# Patient Record
Sex: Male | Born: 1990 | Race: White | Hispanic: No | Marital: Single | Smoking: Current every day smoker
Health system: Southern US, Community
[De-identification: ages and names within clinical notes are randomized; demographics above are authoritative.]

## PROBLEM LIST (undated history)

## (undated) DIAGNOSIS — R112 Nausea with vomiting, unspecified: Secondary | ICD-10-CM

## (undated) DIAGNOSIS — Z9889 Other specified postprocedural states: Secondary | ICD-10-CM

## (undated) DIAGNOSIS — F419 Anxiety disorder, unspecified: Secondary | ICD-10-CM

## (undated) DIAGNOSIS — J343 Hypertrophy of nasal turbinates: Secondary | ICD-10-CM

## (undated) HISTORY — PX: WISDOM TOOTH EXTRACTION: SHX21

## (undated) HISTORY — PX: NOSE SURGERY: SHX723

## (undated) HISTORY — DX: Anxiety disorder, unspecified: F41.9

## (undated) HISTORY — PX: LEG SURGERY: SHX1003

---
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
## 2009-11-24 ENCOUNTER — Ambulatory Visit: Payer: Self-pay | Admitting: Family Medicine

## 2009-11-24 DIAGNOSIS — Z87448 Personal history of other diseases of urinary system: Secondary | ICD-10-CM | POA: Insufficient documentation

## 2009-11-24 LAB — CONVERTED CEMR LAB
Bilirubin Urine: NEGATIVE
Blood in Urine, dipstick: NEGATIVE
Glucose, Urine, Semiquant: NEGATIVE
Ketones, urine, test strip: NEGATIVE
Nitrite: NEGATIVE
Protein, U semiquant: NEGATIVE
Specific Gravity, Urine: 1.015
Urobilinogen, UA: 0.2
WBC Urine, dipstick: NEGATIVE
pH: 6.5

## 2009-11-25 ENCOUNTER — Ambulatory Visit: Payer: Self-pay | Admitting: Family Medicine

## 2009-11-25 DIAGNOSIS — R361 Hematospermia: Secondary | ICD-10-CM | POA: Insufficient documentation

## 2009-11-26 ENCOUNTER — Encounter: Payer: Self-pay | Admitting: Family Medicine

## 2009-11-26 LAB — CONVERTED CEMR LAB
Chlamydia, DNA Probe: NEGATIVE
GC Probe Amp, Genital: NEGATIVE

## 2009-11-28 ENCOUNTER — Ambulatory Visit: Payer: Self-pay | Admitting: Family Medicine

## 2009-11-29 ENCOUNTER — Encounter (INDEPENDENT_AMBULATORY_CARE_PROVIDER_SITE_OTHER): Payer: Self-pay | Admitting: *Deleted

## 2009-12-02 ENCOUNTER — Ambulatory Visit: Payer: Self-pay | Admitting: Family Medicine

## 2009-12-14 ENCOUNTER — Encounter: Payer: Self-pay | Admitting: Family Medicine

## 2009-12-20 ENCOUNTER — Ambulatory Visit: Payer: Self-pay | Admitting: Occupational Medicine

## 2010-03-03 ENCOUNTER — Ambulatory Visit: Payer: Self-pay | Admitting: Family Medicine

## 2010-03-03 DIAGNOSIS — F419 Anxiety disorder, unspecified: Secondary | ICD-10-CM

## 2010-03-03 DIAGNOSIS — N509 Disorder of male genital organs, unspecified: Secondary | ICD-10-CM | POA: Insufficient documentation

## 2010-03-03 DIAGNOSIS — R6882 Decreased libido: Secondary | ICD-10-CM | POA: Insufficient documentation

## 2010-03-03 DIAGNOSIS — F32A Depression, unspecified: Secondary | ICD-10-CM | POA: Insufficient documentation

## 2010-03-03 DIAGNOSIS — F329 Major depressive disorder, single episode, unspecified: Secondary | ICD-10-CM

## 2010-03-06 ENCOUNTER — Encounter: Payer: Self-pay | Admitting: Family Medicine

## 2010-03-07 LAB — CONVERTED CEMR LAB: Testosterone: 519.23 ng/dL (ref 350–890)

## 2010-03-13 ENCOUNTER — Ambulatory Visit: Payer: Self-pay | Admitting: Internal Medicine

## 2010-03-13 ENCOUNTER — Telehealth (INDEPENDENT_AMBULATORY_CARE_PROVIDER_SITE_OTHER): Payer: Self-pay | Admitting: *Deleted

## 2010-03-13 LAB — CONVERTED CEMR LAB
Bilirubin Urine: NEGATIVE
Blood in Urine, dipstick: NEGATIVE
GC Probe Amp, Urine: NEGATIVE
Glucose, Urine, Semiquant: NEGATIVE
Ketones, urine, test strip: NEGATIVE
Nitrite: NEGATIVE
Protein, U semiquant: 100
Specific Gravity, Urine: 1.02
Urobilinogen, UA: 0.2
WBC Urine, dipstick: NEGATIVE
pH: 5

## 2010-03-14 ENCOUNTER — Encounter: Payer: Self-pay | Admitting: Internal Medicine

## 2010-03-14 LAB — CONVERTED CEMR LAB
AFP-Tumor Marker: 1.4 ng/mL (ref 0.0–8.0)
Bacteria, UA: NONE SEEN
Basophils Absolute: 0.1 10*3/uL (ref 0.0–0.1)
Basophils Relative: 1 % (ref 0–1)
Bilirubin Urine: NEGATIVE
Casts: NONE SEEN /lpf
Crystals: NONE SEEN
Eosinophils Absolute: 0.7 10*3/uL (ref 0.0–0.7)
Eosinophils Relative: 11 % — ABNORMAL HIGH (ref 0–5)
GC Probe Amp, Urine: NEGATIVE
HCT: 47.7 % (ref 39.0–52.0)
Hemoglobin, Urine: NEGATIVE
Hemoglobin: 15.3 g/dL (ref 13.0–17.0)
Ketones, ur: NEGATIVE mg/dL
Leukocytes, UA: NEGATIVE
Lymphocytes Relative: 45 % (ref 12–46)
Lymphs Abs: 2.9 10*3/uL (ref 0.7–4.0)
MCHC: 32.1 g/dL (ref 30.0–36.0)
MCV: 92.4 fL (ref 78.0–100.0)
Monocytes Absolute: 0.8 10*3/uL (ref 0.1–1.0)
Monocytes Relative: 12 % (ref 3–12)
Neutro Abs: 2 10*3/uL (ref 1.7–7.7)
Neutrophils Relative %: 31 % — ABNORMAL LOW (ref 43–77)
Nitrite: NEGATIVE
PSA: 0.33 ng/mL (ref 0.10–4.00)
Platelets: 261 10*3/uL (ref 150–400)
Protein, ur: 100 mg/dL — AB
RBC / HPF: NONE SEEN (ref <3)
RBC: 5.16 M/uL (ref 4.22–5.81)
RDW: 13.6 % (ref 11.5–15.5)
Specific Gravity, Urine: 1.023 (ref 1.005–1.030)
Squamous Epithelial / LPF: NONE SEEN /lpf
TSH: 1.192 microintl units/mL (ref 0.350–4.500)
Urine Glucose: NEGATIVE mg/dL
Urobilinogen, UA: 0.2 (ref 0.0–1.0)
WBC, UA: NONE SEEN cells/hpf (ref <3)
WBC: 6.4 10*3/uL (ref 4.0–10.5)
pH: 6.5 (ref 5.0–8.0)

## 2010-03-15 ENCOUNTER — Telehealth: Payer: Self-pay | Admitting: Internal Medicine

## 2010-03-17 ENCOUNTER — Encounter: Payer: Self-pay | Admitting: Family Medicine

## 2010-03-22 ENCOUNTER — Telehealth (INDEPENDENT_AMBULATORY_CARE_PROVIDER_SITE_OTHER): Payer: Self-pay | Admitting: *Deleted

## 2010-03-30 ENCOUNTER — Ambulatory Visit: Payer: Self-pay | Admitting: Occupational Medicine

## 2010-04-05 ENCOUNTER — Ambulatory Visit (HOSPITAL_COMMUNITY): Admission: RE | Admit: 2010-04-05 | Discharge: 2010-04-05 | Payer: Self-pay | Admitting: Psychiatry

## 2010-04-05 ENCOUNTER — Emergency Department (HOSPITAL_COMMUNITY): Admission: EM | Admit: 2010-04-05 | Discharge: 2010-04-06 | Payer: Self-pay | Admitting: Emergency Medicine

## 2010-04-06 ENCOUNTER — Inpatient Hospital Stay (HOSPITAL_COMMUNITY): Admission: RE | Admit: 2010-04-06 | Discharge: 2010-04-10 | Payer: Self-pay | Admitting: Psychiatry

## 2010-04-06 ENCOUNTER — Ambulatory Visit: Payer: Self-pay | Admitting: Psychiatry

## 2010-04-08 ENCOUNTER — Ambulatory Visit (HOSPITAL_COMMUNITY): Admission: RE | Admit: 2010-04-08 | Discharge: 2010-04-08 | Payer: Self-pay | Admitting: Psychiatry

## 2010-04-27 ENCOUNTER — Ambulatory Visit (HOSPITAL_COMMUNITY): Payer: Self-pay | Admitting: Psychology

## 2010-05-02 ENCOUNTER — Ambulatory Visit (HOSPITAL_COMMUNITY): Payer: Self-pay | Admitting: Psychology

## 2010-05-04 ENCOUNTER — Ambulatory Visit (HOSPITAL_COMMUNITY): Payer: Self-pay | Admitting: Psychiatry

## 2010-05-09 ENCOUNTER — Ambulatory Visit (HOSPITAL_COMMUNITY): Payer: Self-pay | Admitting: Psychology

## 2010-05-18 ENCOUNTER — Ambulatory Visit (HOSPITAL_COMMUNITY): Payer: Self-pay | Admitting: Psychology

## 2010-05-23 ENCOUNTER — Ambulatory Visit (HOSPITAL_COMMUNITY): Payer: Self-pay | Admitting: Psychology

## 2010-05-24 ENCOUNTER — Telehealth (INDEPENDENT_AMBULATORY_CARE_PROVIDER_SITE_OTHER): Payer: Self-pay | Admitting: *Deleted

## 2010-06-07 ENCOUNTER — Ambulatory Visit (HOSPITAL_COMMUNITY): Payer: Self-pay | Admitting: Psychology

## 2010-06-12 ENCOUNTER — Ambulatory Visit (HOSPITAL_COMMUNITY): Payer: Self-pay | Admitting: Psychology

## 2010-06-20 ENCOUNTER — Ambulatory Visit (HOSPITAL_COMMUNITY): Payer: Self-pay | Admitting: Psychology

## 2010-06-21 ENCOUNTER — Ambulatory Visit (HOSPITAL_COMMUNITY): Payer: Self-pay | Admitting: Psychiatry

## 2010-06-27 ENCOUNTER — Ambulatory Visit (HOSPITAL_COMMUNITY): Payer: Self-pay | Admitting: Psychology

## 2010-08-24 ENCOUNTER — Ambulatory Visit (HOSPITAL_COMMUNITY): Payer: Self-pay | Admitting: Psychiatry

## 2010-08-31 ENCOUNTER — Ambulatory Visit (HOSPITAL_COMMUNITY): Payer: Self-pay | Admitting: Psychology

## 2010-09-20 ENCOUNTER — Ambulatory Visit (HOSPITAL_COMMUNITY): Payer: Self-pay | Admitting: Psychology

## 2010-09-26 ENCOUNTER — Ambulatory Visit: Payer: Self-pay | Admitting: Family Medicine

## 2010-09-26 DIAGNOSIS — M76899 Other specified enthesopathies of unspecified lower limb, excluding foot: Secondary | ICD-10-CM | POA: Insufficient documentation

## 2010-09-26 DIAGNOSIS — M461 Sacroiliitis, not elsewhere classified: Secondary | ICD-10-CM | POA: Insufficient documentation

## 2010-09-26 DIAGNOSIS — M069 Rheumatoid arthritis, unspecified: Secondary | ICD-10-CM | POA: Insufficient documentation

## 2010-10-10 ENCOUNTER — Ambulatory Visit: Payer: Self-pay | Admitting: Family Medicine

## 2010-10-10 DIAGNOSIS — M545 Low back pain, unspecified: Secondary | ICD-10-CM | POA: Insufficient documentation

## 2010-10-11 ENCOUNTER — Encounter: Payer: Self-pay | Admitting: Family Medicine

## 2010-10-19 ENCOUNTER — Ambulatory Visit (HOSPITAL_COMMUNITY): Payer: Self-pay | Admitting: Psychology

## 2010-11-02 ENCOUNTER — Ambulatory Visit (HOSPITAL_COMMUNITY): Payer: Self-pay | Admitting: Psychology

## 2010-11-20 ENCOUNTER — Ambulatory Visit (HOSPITAL_COMMUNITY)
Admission: RE | Admit: 2010-11-20 | Discharge: 2010-11-20 | Payer: Self-pay | Source: Home / Self Care | Attending: Psychiatry | Admitting: Psychiatry

## 2010-12-04 ENCOUNTER — Ambulatory Visit (HOSPITAL_COMMUNITY)
Admission: RE | Admit: 2010-12-04 | Discharge: 2010-12-04 | Payer: Self-pay | Source: Home / Self Care | Attending: Psychology | Admitting: Psychology
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
## 2010-12-05 NOTE — Letter (Signed)
Summary: CONTROLLED MED RX POLICY  CONTROLLED MED RX POLICY   Imported By: Dannette Barbara 09/26/2010 20:04:46  _____________________________________________________________________  External Attachment:    Type:   Image     Comment:   External Document
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
## 2010-12-05 NOTE — Assessment & Plan Note (Signed)
Summary: viral infection   Vital Signs:  Patient Profile:   20 Years Old Male Height:     71 inches (180.34 cm) Weight:      159 pounds O2 Sat:      96 % Temp:     98.7 degrees F axillary Pulse rate:   85 / minute BP sitting:   126 / 73  (left arm) Cuff size:   regular  Vitals Entered By: Harlene Salts (January 08, 2007 10:49 AM)               Visit Type:  acute PCP:  K Bowen  Chief Complaint:  aching in joints in arms and legs and runny nose in the mornings .  History of Present Illness: 20 yo WM presents today for 1 day of joint aches all over his body.  Subjective fevers.  Scratchy throat, no cough.  No GI upset.  Rhinorrhea.  Fatigue and Malaise.  Many sick contacts at school.  Ibuprofen helps.    Prior Medications: Current Allergies: No known allergies      Review of Systems      See HPI   Physical Exam  General:      good color and thin.   Head:      normocephalic and atraumatic  Eyes:      clear conjunctiva Ears:      TMs translucent and gray Nose:      clear nasal discharge Mouth:      throat injected, white exudate, post nasal drip, and 1+ tonsilar edema.   Neck:      shotty ant cervical nodes.   Lungs:      Clear to ausc, no crackles, rhonchi or wheezing, no grunting, flaring or retractions  Heart:      RRR without murmur  Abdomen:      BS+, soft, non-tender, no masses, no hepatosplenomegaly  Musculoskeletal:      full ROM of shoulders, elbows, wrists, hips, knees and ankles with no effusions or redness, neck supple with full cspine ROM Skin:      mild facial acne    Impression & Recommendations:  Problem # 1:  VIRAL INFECTION, ACUTE (ICD-079.99) Influenza negative with fever, exudates, and body aches.  Mom insistent on Donley being treated for flu.  Tamiflu Rx given with advisement that it may not help due to influenza negative test result.  Supportive care with OTC cold and flu  meds.  F/U if not better in 1 wk.  School note given for  Wed, Thurs and Fri. Orders: Flu A+B (16109) Rapid Strep (60454) Est. Patient Level III (09811)   Medications Added to Medication List This Visit: 1)  Tamiflu 75 Mg Caps (Oseltamivir phosphate) .Marland Kitchen.. 1 tab by mouth two times a day x 5 days   Patient Instructions: 1)  Take 5 days of Tamiflu in addition to an OTC cold and flu medicine with Ibuprofen for fevers, aches and pains. 2)  Out of school for Wed--> Friday. 3)  Call if not better in 1 wk.  Laboratory Results   Urine Tests        Blood Tests    Date/Time Received:  January 08, 2007 11:37 AM  Date/Time Reported:  January 08, 2007 11:37 AM   Other Tests Wet Mount/KOH Rapid Strep: negative Influenza: negative  Kit Test Internal QC: Negative   (Normal Range: Negative)

## 2010-12-05 NOTE — Progress Notes (Signed)
Summary: Dismissal Letter Returned Resent 1st Class Mail  Letter undeliverable. Resent by first class mail. Vara Guardian  May 24, 2010 12:14 PM

## 2010-12-05 NOTE — Letter (Signed)
Summary: PSYCHIATRY NOTE  PSYCHIATRY NOTE   Imported By: Harlene Salts 09/23/2007 15:15:56  _____________________________________________________________________  External Attachment:    Type:   Image     Comment:   External Document

## 2010-12-05 NOTE — Assessment & Plan Note (Signed)
Summary: anxiety   Vital Signs:  Patient Profile:   20 Years Old Male Height:     72 inches (180.34 cm) Weight:      156 pounds O2 Sat:      99 % Temp:     97.2 degrees F oral Pulse rate:   54 / minute BP sitting:   118 / 68  (left arm) Cuff size:   regular  Vitals Entered By: Harlene Salts (September 08, 2007 10:59 AM)                 Visit Type:  Acute Visit PCP:  Seymour Bars DO  Chief Complaint:  anxiety.  History of Present Illness: 20 yo WM  seen for worsening anxiety since stopping fluoxetine a few months ago.  Was seeing a psychiatrist for anxiety and OCD but stopped and plans to see Behavioral Health 11-20.  Was on Fluoxetine for 1 yr, but began having diarrhea and nausea after dose increased to 60 mg/ day.  Weaned himselft off and has been more anxious, having heart palpitations, trouble concentrating and poor school performance.  Has occasional chest tightness.  Denies suicidal ideations.  Stopped seeing counselor a few yrs ago for his OCD.  Took Paxil prior to Prozac but stopped due to SE's.  No problems sleeping.  Current Allergies: No known allergies    Social History:    Reviewed history from 08/13/2006 and no changes required:       High shool student.  Lives w/ mom and dad.  Nonsmoker.    Physical Exam  General:      Well appearing adolescent,no acute distress Head:      [place]/at Mouth:      Clear without erythema, edema or exudate, mucous membranes moist Neck:      supple without adenopathy  Lungs:      Clear to ausc, no crackles, rhonchi or wheezing, no grunting, flaring or retractions  Heart:      RRR without murmur  Psychiatric:      anxious.     Review of Systems      See HPI    Impression & Recommendations:  Problem # 1:  ANXIETY STATE, UNSPECIFIED (ICD-300.00) Assessment: Deteriorated Will try him on short term low dose benzo two times a day until appt with [facility].  Call if any problems.   His updated medication list for  this problem includes:    Alprazolam 0.25 Mg Tabs (Alprazolam) .Marland Kitchen... 1 tab by mouth two times a day as needed anxiety  Orders: Est. Patient Level III (45409)   Problem # 2:  OBSESSIVE-COMPULSIVE DISORDER (ICD-300.3) As per #1.  May benefit from counseling and a trial of zoloft.  Will leave up to pscyh given his intolerance to 2 SSRI's. Orders: Est. Patient Level III (81191)   Medications Added to Medication List This Visit: 1)  Alprazolam 0.25 Mg Tabs (Alprazolam) .Marland Kitchen.. 1 tab by mouth two times a day as needed anxiety     Prescriptions: ALPRAZOLAM 0.25 MG  TABS (ALPRAZOLAM) 1 tab by mouth two times a day as needed anxiety  #60 x 0   Entered and Authorized by:   Seymour Bars DO   Signed by:   Seymour Bars DO on 09/08/2007   Method used:   Print then Give to Patient   RxID:   [id number]  ]
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
## 2010-12-05 NOTE — Letter (Signed)
Summary: Discharge Letter  [facility]  [address] [place], [place] [address]   Phone: [phone]  Fax: [phone]       03/17/2010 MRN: [id number]  ZARIUS FURR [address] [place], [place]  [address]  Dear Mr. Egelston,   I find it necessary to inform you that I will no longer be able to provide medical care to you.  Since your condition requires medical attention, I suggest that you place your self under the care of another physician without delay. If you desire, Dr. Thomos Lemons will be available for emergency care for 30 days after you receive this letter. His office number is [phone] and their address is [address], [place], [place]. [address].  This should give you ample time to select a physician of your choice from the many competent providers in this area. You may want to call the local medical society or [facility]'s physician referral service [phone]) for their assistance in locating a new physician. With your written authorization, I will make a copy of your medical record available to your new physician.   Sincerely,    Seymour Bars, D.O.
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
## 2010-12-05 NOTE — Progress Notes (Signed)
  Phone Note Outgoing Call Call back at [facility] Phone [phone]   Call placed by: Fabienne Bruns,  Mar 13, 2010 10:01 AM Call placed to: Patient Summary of Call: Patient contacted regarding appointment scheduled for this afternoon for prostate pain, Dr Cathey Endow is requesting to discharge patient due to nature of symptoms for this patient are not within her scope of expertise.  Dr Cathey Endow has sent patient to Urologists and received results that are normal and is not comfortable seeing this patient for this problem.  Recommending that he transfer care to another provider for his prostate issues.  Initial call taken by: Fabienne Bruns,  Mar 13, 2010 10:04 AM

## 2010-12-05 NOTE — Consult Note (Signed)
Summary: [facility]  [facility]   Imported By: Lanelle Bal 03/10/2010 14:02:53  _____________________________________________________________________  External Attachment:    Type:   Image     Comment:   External Document

## 2010-12-05 NOTE — Letter (Signed)
Summary: Primary Care Consult Scheduled Letter  [place] [facility]  [address]   [place], [place] [address]   Phone: [phone]  Fax: [phone]      11/29/2009 MRN: [id number]  Luis Lynch [address] [place], [place]  [address]    Dear Mr. Legrand,      We have scheduled an appointment for you.  At the recommendation of Dr.BOWEN , we have scheduled you a consult with [facility]    , DR Heron Nay  on [facility] 2,2011 at 8:30AM.  Their address is [address]  [address] ,  [place] . The office phone number is [phone].  If this appointment day and time is not convenient for you, please feel free to call the office of the doctor you are being referred to at the number listed above and reschedule the appointment.     It is important for you to keep your scheduled appointments. We are here to make sure you are given good patient care. If you have questions or you have made changes to your appointment, please notify us at  [phone], ask for HELEN.    Thank you,  Patient Care Coordinator [place] [facility]

## 2010-12-05 NOTE — Progress Notes (Signed)
Summary: Dismissal Letter Mailed Certified Mail  Dismissal Letter sent by certified mail. Vara Guardian  Mar 22, 2010 9:45 AM
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
## 2010-12-05 NOTE — Letter (Signed)
Summary: REFERRAL SPORTS MED DR. Margaretha Sheffield  Internal Correspondence   Imported By: Dannette Barbara 10/11/2010 08:47:32  _____________________________________________________________________  External Attachment:    Type:   Image     Comment:   External Document

## 2010-12-05 NOTE — Letter (Signed)
Summary: Out of School  [facility]  [address]   [place], [place] [address]   Phone: [phone]  Fax: [phone]    November 25, 2009   Student:  Francesco Sor Adkison    To Whom It May Concern:   For Medical reasons, please excuse the above named student from class this morning.    If you need additional information, please feel free to contact our office.   Sincerely,    Donna Christen MD    ****This is a legal document and cannot be tampered with.  Schools are authorized to verify all information and to do so accordingly.

## 2010-12-05 NOTE — Assessment & Plan Note (Signed)
Summary: SORE THROAT   Vital Signs:  Patient Profile:   20 Years Old Male Weight:      152 pounds Temp:     97.2 degrees F oral Pulse rate:   73 / minute BP sitting:   131 / 88  (left arm) Cuff size:   regular  Vitals Entered By: Harlene Salts (November 26, 2006 2:48 PM)                  Visit Type:  acute  Chief Complaint:  sore throat.  Acute Pediatric Visit History:      The patient presents with headache, nasal discharge, and sore throat.  These symptoms began 2 days ago.  He is not having fever, nausea, rash, or sinus problems.        'Cold' or URI symptoms have been present with the sore throat.  There has been a recent exposure to strep.              Review of Systems  Eyes      Complains of blurring.  GI      Complains of nausea, vomiting, and abdominal pain.   Physical Exam  General:      Well appearing adolescent,no acute distress Head:      normocephalic and atraumatic; sinuses NTTP Ears:      TMs appear normal Nose:      clear rhinorrhea Mouth:      throat injected.  no exudate. Neck:      supple without adenopathy  Lungs:      Clear to ausc, no crackles, rhonchi or wheezing, no grunting, flaring or retractions  Heart:      RRR without murmur  Abdomen:      BS+, soft, non-tender, no masses, no hepatosplenomegaly  Skin:      intact without lesions, rashes  Cervical nodes:      sl enlarged anterior cervical nodes    Impression & Recommendations:  Problem # 1:  SORE THROAT (ICD-462) Strep test negative, likely viral.  Educated pt and mom.  Supportive care.  If not better in 1 wk, call.  School note given for today. Orders: Rapid Strep ([address]) Est. Patient Level II ([address])    Patient Instructions: 1)  Use Tylenol Sore throat for symptoms.  Can use Chlorasceptic and throat lozenges. 2)  Call if not better after 10 days.  Laboratory Results   Urine Tests        Blood Tests    Date/Time Received:  November 26, 2006  3:05 PM  Date/Time Reported:  November 26, 2006 3:05 PM  Other Tests Wet Mount/KOH Rapid Strep: negative  Kit Test Internal QC: Negative   (Normal Range: Negative)

## 2010-12-05 NOTE — Progress Notes (Signed)
Summary: lab results  Phone Note Outgoing Call   Summary of Call: call pt - blood tests (blood counts, PSA, urine test for STD - all negative) Initial call taken by: D. Thomos Lemons DO,  Mar 15, 2010 10:15 AM  Follow-up for Phone Call        Left message on machine to return my call.  Mervin Kung CMA  Mar 15, 2010 10:56 AM   Notified pt. of normal labs.  Mervin Kung CMA  Mar 15, 2010 1:50 PM

## 2010-12-20 ENCOUNTER — Encounter (INDEPENDENT_AMBULATORY_CARE_PROVIDER_SITE_OTHER): Payer: BC Managed Care – PPO | Admitting: Psychology

## 2010-12-20 DIAGNOSIS — F331 Major depressive disorder, recurrent, moderate: Secondary | ICD-10-CM

## 2011-01-02 ENCOUNTER — Encounter (INDEPENDENT_AMBULATORY_CARE_PROVIDER_SITE_OTHER): Payer: BC Managed Care – PPO | Admitting: Psychology

## 2011-01-02 DIAGNOSIS — F331 Major depressive disorder, recurrent, moderate: Secondary | ICD-10-CM

## 2011-01-03 ENCOUNTER — Encounter (HOSPITAL_COMMUNITY): Payer: Self-pay | Admitting: Psychiatry

## 2011-01-04 ENCOUNTER — Encounter (HOSPITAL_COMMUNITY): Payer: Self-pay | Admitting: Psychiatry

## 2011-01-22 LAB — DIFFERENTIAL
Basophils Absolute: 0 10*3/uL (ref 0.0–0.1)
Basophils Relative: 1 % (ref 0–1)
Eosinophils Absolute: 0.5 10*3/uL (ref 0.0–0.7)
Eosinophils Relative: 6 % — ABNORMAL HIGH (ref 0–5)
Lymphocytes Relative: 33 % (ref 12–46)
Lymphs Abs: 2.9 10*3/uL (ref 0.7–4.0)
Monocytes Absolute: 0.7 10*3/uL (ref 0.1–1.0)
Monocytes Relative: 8 % (ref 3–12)
Neutro Abs: 4.7 10*3/uL (ref 1.7–7.7)
Neutrophils Relative %: 53 % (ref 43–77)

## 2011-01-22 LAB — URINALYSIS, ROUTINE W REFLEX MICROSCOPIC
Bilirubin Urine: NEGATIVE
Glucose, UA: NEGATIVE mg/dL
Hgb urine dipstick: NEGATIVE
Ketones, ur: NEGATIVE mg/dL
Nitrite: NEGATIVE
Protein, ur: NEGATIVE mg/dL
Specific Gravity, Urine: 1.02 (ref 1.005–1.030)
Urobilinogen, UA: 0.2 mg/dL (ref 0.0–1.0)
pH: 7 (ref 5.0–8.0)

## 2011-01-22 LAB — COMPREHENSIVE METABOLIC PANEL
ALT: 20 U/L (ref 0–53)
AST: 23 U/L (ref 0–37)
Albumin: 4.5 g/dL (ref 3.5–5.2)
Alkaline Phosphatase: 68 U/L (ref 39–117)
BUN: 12 mg/dL (ref 6–23)
CO2: 30 mEq/L (ref 19–32)
Calcium: 9.4 mg/dL (ref 8.4–10.5)
Chloride: 106 mEq/L (ref 96–112)
Creatinine, Ser: 1.02 mg/dL (ref 0.4–1.5)
GFR calc Af Amer: >60 mL/min (ref >60)
GFR calc non Af Amer: >60 mL/min (ref >60)
Glucose, Bld: 111 mg/dL — ABNORMAL HIGH (ref 70–99)
Potassium: 4.1 mEq/L (ref 3.5–5.1)
Sodium: 142 mEq/L (ref 135–145)
Total Bilirubin: 0.7 mg/dL (ref 0.3–1.2)
Total Protein: 7.1 g/dL (ref 6.0–8.3)

## 2011-01-22 LAB — T3, FREE: T3, Free: 3.2 pg/mL (ref 2.3–4.2)

## 2011-01-22 LAB — ETHANOL: Alcohol, Ethyl (B): <5 mg/dL (ref 0–10)

## 2011-01-22 LAB — CBC
HCT: 44.8 % (ref 39.0–52.0)
Hemoglobin: 15.1 g/dL (ref 13.0–17.0)
MCHC: 33.7 g/dL (ref 30.0–36.0)
MCV: 93.2 fL (ref 78.0–100.0)
Platelets: 208 10*3/uL (ref 150–400)
RBC: 4.81 MIL/uL (ref 4.22–5.81)
RDW: 13.7 % (ref 11.5–15.5)
WBC: 8.9 10*3/uL (ref 4.0–10.5)

## 2011-01-22 LAB — RAPID URINE DRUG SCREEN, HOSP PERFORMED
Amphetamines: NOT DETECTED
Barbiturates: NOT DETECTED
Benzodiazepines: NOT DETECTED
Cocaine: NOT DETECTED
Opiates: NOT DETECTED
Tetrahydrocannabinol: NOT DETECTED

## 2011-01-22 LAB — T4, FREE: Free T4: 1.45 ng/dL (ref 0.80–1.80)

## 2011-01-22 LAB — TSH: TSH: 1.297 u[IU]/mL (ref 0.350–4.500)

## 2011-01-23 ENCOUNTER — Encounter (HOSPITAL_COMMUNITY): Payer: BC Managed Care – PPO | Admitting: Psychology

## 2011-02-14 ENCOUNTER — Encounter (INDEPENDENT_AMBULATORY_CARE_PROVIDER_SITE_OTHER): Payer: BC Managed Care – PPO | Admitting: Psychology

## 2011-02-14 DIAGNOSIS — F331 Major depressive disorder, recurrent, moderate: Secondary | ICD-10-CM

## 2011-02-28 ENCOUNTER — Encounter (HOSPITAL_COMMUNITY): Payer: BC Managed Care – PPO | Admitting: Psychology

## 2011-04-11 ENCOUNTER — Encounter (HOSPITAL_COMMUNITY): Payer: BC Managed Care – PPO | Admitting: Psychology

## 2011-04-12 ENCOUNTER — Encounter (INDEPENDENT_AMBULATORY_CARE_PROVIDER_SITE_OTHER): Payer: BC Managed Care – PPO | Admitting: Psychology

## 2011-04-12 DIAGNOSIS — F331 Major depressive disorder, recurrent, moderate: Secondary | ICD-10-CM

## 2011-04-18 ENCOUNTER — Encounter (INDEPENDENT_AMBULATORY_CARE_PROVIDER_SITE_OTHER): Payer: BC Managed Care – PPO | Admitting: Psychiatry

## 2011-04-18 DIAGNOSIS — F429 Obsessive-compulsive disorder, unspecified: Secondary | ICD-10-CM

## 2011-04-23 ENCOUNTER — Encounter (INDEPENDENT_AMBULATORY_CARE_PROVIDER_SITE_OTHER): Payer: BC Managed Care – PPO | Admitting: Psychology

## 2011-04-23 DIAGNOSIS — F331 Major depressive disorder, recurrent, moderate: Secondary | ICD-10-CM

## 2011-04-23 DIAGNOSIS — F411 Generalized anxiety disorder: Secondary | ICD-10-CM

## 2011-04-26 ENCOUNTER — Encounter (HOSPITAL_COMMUNITY): Payer: BC Managed Care – PPO | Admitting: Psychology

## 2011-05-07 ENCOUNTER — Encounter (INDEPENDENT_AMBULATORY_CARE_PROVIDER_SITE_OTHER): Payer: BC Managed Care – PPO | Admitting: Psychology

## 2011-05-07 DIAGNOSIS — F331 Major depressive disorder, recurrent, moderate: Secondary | ICD-10-CM

## 2011-05-07 DIAGNOSIS — F605 Obsessive-compulsive personality disorder: Secondary | ICD-10-CM

## 2011-05-21 ENCOUNTER — Encounter (INDEPENDENT_AMBULATORY_CARE_PROVIDER_SITE_OTHER): Payer: BC Managed Care – PPO | Admitting: Psychology

## 2011-05-21 DIAGNOSIS — F429 Obsessive-compulsive disorder, unspecified: Secondary | ICD-10-CM

## 2011-05-21 DIAGNOSIS — F331 Major depressive disorder, recurrent, moderate: Secondary | ICD-10-CM

## 2011-05-30 ENCOUNTER — Encounter (INDEPENDENT_AMBULATORY_CARE_PROVIDER_SITE_OTHER): Payer: BC Managed Care – PPO | Admitting: Psychiatry

## 2011-05-30 DIAGNOSIS — F339 Major depressive disorder, recurrent, unspecified: Secondary | ICD-10-CM

## 2011-05-30 DIAGNOSIS — F429 Obsessive-compulsive disorder, unspecified: Secondary | ICD-10-CM

## 2011-06-01 ENCOUNTER — Encounter: Payer: Self-pay | Admitting: Family Medicine

## 2011-06-01 ENCOUNTER — Inpatient Hospital Stay (INDEPENDENT_AMBULATORY_CARE_PROVIDER_SITE_OTHER)
Admission: RE | Admit: 2011-06-01 | Discharge: 2011-06-01 | Disposition: A | Discharge status: Home / Self Care | Payer: BC Managed Care – PPO | Source: Ambulatory Visit | Attending: Family Medicine | Admitting: Family Medicine

## 2011-06-01 DIAGNOSIS — J029 Acute pharyngitis, unspecified: Secondary | ICD-10-CM

## 2011-06-01 DIAGNOSIS — J069 Acute upper respiratory infection, unspecified: Secondary | ICD-10-CM

## 2011-06-01 LAB — CONVERTED CEMR LAB: Rapid Strep: NEGATIVE

## 2011-06-04 ENCOUNTER — Telehealth (INDEPENDENT_AMBULATORY_CARE_PROVIDER_SITE_OTHER): Payer: Self-pay | Admitting: Emergency Medicine

## 2011-06-04 ENCOUNTER — Encounter (HOSPITAL_COMMUNITY): Payer: BC Managed Care – PPO | Admitting: Psychology

## 2011-06-21 ENCOUNTER — Encounter: Payer: Self-pay | Admitting: Emergency Medicine

## 2011-06-21 ENCOUNTER — Inpatient Hospital Stay (INDEPENDENT_AMBULATORY_CARE_PROVIDER_SITE_OTHER)
Admission: RE | Admit: 2011-06-21 | Discharge: 2011-06-21 | Disposition: A | Discharge status: Home / Self Care | Payer: BC Managed Care – PPO | Source: Ambulatory Visit | Attending: Emergency Medicine | Admitting: Emergency Medicine

## 2011-06-21 DIAGNOSIS — R059 Cough, unspecified: Secondary | ICD-10-CM

## 2011-06-21 DIAGNOSIS — R05 Cough: Secondary | ICD-10-CM

## 2011-06-21 DIAGNOSIS — J069 Acute upper respiratory infection, unspecified: Secondary | ICD-10-CM

## 2011-07-02 ENCOUNTER — Encounter: Payer: Self-pay | Admitting: Family Medicine

## 2011-07-02 ENCOUNTER — Inpatient Hospital Stay (INDEPENDENT_AMBULATORY_CARE_PROVIDER_SITE_OTHER)
Admission: RE | Admit: 2011-07-02 | Discharge: 2011-07-02 | Disposition: A | Discharge status: Home / Self Care | Payer: BC Managed Care – PPO | Source: Ambulatory Visit | Attending: Family Medicine | Admitting: Family Medicine

## 2011-07-02 DIAGNOSIS — R05 Cough: Secondary | ICD-10-CM

## 2011-07-02 DIAGNOSIS — J019 Acute sinusitis, unspecified: Secondary | ICD-10-CM

## 2011-07-02 DIAGNOSIS — R059 Cough, unspecified: Secondary | ICD-10-CM

## 2011-07-02 DIAGNOSIS — J209 Acute bronchitis, unspecified: Secondary | ICD-10-CM

## 2011-07-02 DIAGNOSIS — J309 Allergic rhinitis, unspecified: Secondary | ICD-10-CM | POA: Insufficient documentation

## 2011-07-02 DIAGNOSIS — J069 Acute upper respiratory infection, unspecified: Secondary | ICD-10-CM

## 2011-08-29 ENCOUNTER — Encounter (HOSPITAL_COMMUNITY): Payer: BC Managed Care – PPO | Admitting: Psychiatry

## 2011-09-19 ENCOUNTER — Encounter (HOSPITAL_COMMUNITY): Payer: BC Managed Care – PPO | Admitting: Psychology

## 2011-10-01 ENCOUNTER — Ambulatory Visit (INDEPENDENT_AMBULATORY_CARE_PROVIDER_SITE_OTHER): Payer: BC Managed Care – PPO | Admitting: Psychology

## 2011-10-01 ENCOUNTER — Encounter (HOSPITAL_COMMUNITY): Payer: Self-pay | Admitting: Psychology

## 2011-10-01 DIAGNOSIS — F411 Generalized anxiety disorder: Secondary | ICD-10-CM

## 2011-10-01 DIAGNOSIS — F331 Major depressive disorder, recurrent, moderate: Secondary | ICD-10-CM

## 2011-10-01 NOTE — Patient Instructions (Signed)
1. Call Dr. Christell Constant regarding medication refill. 2. Therapy appointment in three weeks (at patient's request).
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
## 2011-10-02 ENCOUNTER — Encounter (HOSPITAL_COMMUNITY): Payer: Self-pay

## 2011-10-03 ENCOUNTER — Encounter (HOSPITAL_COMMUNITY): Payer: BC Managed Care – PPO | Admitting: Psychiatry

## 2011-10-08 NOTE — Letter (Signed)
Summary: Out of Work  [facility]  [address]   [place], [place] [address]   Phone: [phone]  Fax: [phone]    June 01, 2011   Employee:  [employer]    To Whom It May Concern:   For Medical reasons, please excuse the above named employee from work today.  If you need additional information, please feel free to contact our office.         Sincerely,    Donna Christen MD
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
## 2011-10-08 NOTE — Telephone Encounter (Signed)
  Phone Note Outgoing Call Call back at [facility] Phone [phone]   Call placed by: Lavell Islam RN,  June 04, 2011 1:26 PM Action Taken: Phone Call Completed Summary of Call: Spoke with patient who states he is actually feeling worse today; gave negative confirmation throat culture results; has just filled ABX and will take entire course. Initial call taken by: Lavell Islam RN,  June 04, 2011 1:27 PM

## 2011-10-11 ENCOUNTER — Ambulatory Visit (INDEPENDENT_AMBULATORY_CARE_PROVIDER_SITE_OTHER): Payer: BC Managed Care – PPO | Admitting: Psychiatry

## 2011-10-11 ENCOUNTER — Encounter (HOSPITAL_COMMUNITY): Payer: Self-pay | Admitting: Psychiatry

## 2011-10-11 VITALS — BP 118/78 | Ht 71.75 in | Wt 172.0 lb

## 2011-10-11 DIAGNOSIS — F411 Generalized anxiety disorder: Secondary | ICD-10-CM

## 2011-10-11 MED ORDER — CITALOPRAM HYDROBROMIDE 20 MG PO TABS: 20.0000 mg | ORAL_TABLET | Freq: Every day | ORAL | Status: DC

## 2011-10-11 NOTE — Progress Notes (Signed)
   [facility] Follow-up Outpatient Visit  Naheem Mosco [facility] Feb 23, 1991   Subjective: The patient is a 20 year old male who has been followed by [facility] since on 09/2007. The patient is currently diagnosed with generalized anxiety disorder. The patient no showed his last appointment with me. In the interim he ran out of the Celexa, he said the pharmacy would not refill it. He has been off it for approximately 2 weeks. He will be finishing his associates degree in today and graduating in 2 weeks. He plans to start looking for a job. This is stressing him out somewhat. He is also distressed about the end of the semester. He's been staying up all night doing school work. This is increasing his stress level. The patient endorses good sleep and appetite. He is not dating.vitamin  Filed Vitals:   10/11/11 1111  BP: 118/78   Mental Status Examination  Appearance: Casually dressed Alert: Yes Attention: good  Cooperative: Yes Eye Contact: Good Speech: Regular rate rhythm and volume Psychomotor Activity: Normal Memory/Concentration: Intact Oriented: person, place, time/date and situation Mood: Euthymic Affect: Full Range Thought Processes and Associations: Logical Fund of Knowledge: Good Thought Content: No suicidal or homicidal thoughts Insight: Fair Judgement: Fair  Diagnosis: Generalized anxiety disorder  Treatment Plan: At this point we will restart the patient's Celexa. I advised him to start 10 mg a day for one week and then increase back to the 20 mg daily. I will see him back in 2 months.  Jamse Mead, MD

## 2011-10-22 ENCOUNTER — Ambulatory Visit (HOSPITAL_COMMUNITY): Payer: BC Managed Care – PPO | Admitting: Psychology

## 2011-12-07 ENCOUNTER — Ambulatory Visit
Admission: RE | Admit: 2011-12-07 | Discharge: 2011-12-07 | Disposition: A | Discharge status: Home / Self Care | Payer: BC Managed Care – PPO | Source: Ambulatory Visit | Attending: Orthopedic Surgery | Admitting: Orthopedic Surgery

## 2011-12-07 ENCOUNTER — Other Ambulatory Visit: Payer: Self-pay | Admitting: Orthopedic Surgery

## 2011-12-07 DIAGNOSIS — M25562 Pain in left knee: Secondary | ICD-10-CM

## 2011-12-12 ENCOUNTER — Ambulatory Visit (HOSPITAL_COMMUNITY): Payer: Self-pay | Admitting: Psychiatry

## 2011-12-28 ENCOUNTER — Other Ambulatory Visit: Payer: Self-pay | Admitting: Orthopedic Surgery

## 2011-12-28 DIAGNOSIS — M25562 Pain in left knee: Secondary | ICD-10-CM

## 2012-01-05 ENCOUNTER — Other Ambulatory Visit: Payer: Self-pay

## 2012-03-25 ENCOUNTER — Ambulatory Visit (INDEPENDENT_AMBULATORY_CARE_PROVIDER_SITE_OTHER): Payer: BC Managed Care – PPO | Admitting: Psychiatry

## 2012-03-25 ENCOUNTER — Encounter (HOSPITAL_COMMUNITY): Payer: Self-pay | Admitting: Psychiatry

## 2012-03-25 VITALS — BP 112/62 | Ht 71.5 in | Wt 168.0 lb

## 2012-03-25 DIAGNOSIS — F411 Generalized anxiety disorder: Secondary | ICD-10-CM

## 2012-03-25 NOTE — Progress Notes (Signed)
   [facility]al Health Follow-up Outpatient Visit  Luis Lynch [facility] 1991-05-23   Subjective: The patient is a 21 year old male who has been followed by [facility] since on 09/2007. The patient is currently diagnosed with generalized anxiety disorder. At his last appointment which was in December, I restarted Celexa. Patient reports today. He states that he has been off the Celexa for a long time. 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 He is still living with dad. He states his been working out almost daily. He's been running approximately 20 miles weekly. He continues to work at the [employer]. He still reports occasional stress, but feels that exercise helps. He does not exercise, his stress level get more elevated. He has a new car, which has a lot of problems and needs to be fixed up. At this point he is not interested in medication. He's not sure about whether or not he may need some when he goes back to school in the fall.  Filed Vitals:   03/25/12 1310  BP: 112/62   Mental Status Examination  Appearance: Casually dressed Alert: Yes Attention: good  Cooperative: Yes Eye Contact: Good Speech: Regular rate rhythm and volume Psychomotor Activity: Normal Memory/Concentration: Intact Oriented: person, place, time/date and situation Mood: Euthymic Affect: Full Range Thought Processes and Associations: Logical Fund of Knowledge: Good Thought Content: No suicidal or homicidal thoughts Insight: Fair Judgement: Fair  Diagnosis: Generalized anxiety disorder  Treatment Plan: At this point I will not start medication, nor schedule patient back. Patient and I have discussed that if he feels the need for medication in the future, he will call me. I will start him on Effexor XR at 37.5 mg daily, and see him back 6 weeks  after I start it.  Jamse Mead, MD

## 2012-04-25 ENCOUNTER — Emergency Department
Admission: EM | Admit: 2012-04-25 | Discharge: 2012-04-25 | Disposition: A | Discharge status: Home Health | Payer: BC Managed Care – PPO | Source: Home / Self Care | Attending: Family Medicine | Admitting: Family Medicine

## 2012-04-25 DIAGNOSIS — N489 Disorder of penis, unspecified: Secondary | ICD-10-CM

## 2012-04-25 DIAGNOSIS — N4889 Other specified disorders of penis: Secondary | ICD-10-CM

## 2012-04-25 MED ORDER — KETOCONAZOLE 2 % EX CREA: TOPICAL_CREAM | Freq: Two times a day (BID) | CUTANEOUS | Status: DC

## 2012-04-25 NOTE — ED Notes (Signed)
Pt c/o singular "spot" on penis.  Pt states it has been present about 6 months.  Pt denies pain, drainage.  Pt states it has increased in redness and diameter.  Pt denies any sexual contact in HX.

## 2012-04-25 NOTE — ED Provider Notes (Signed)
History     CSN: [id number]  Arrival date & time 04/25/12  1426   First MD Initiated Contact with Patient 04/25/12 1442      Chief Complaint  Patient presents with  . Rash     HPI Comments: Patient complains of small spot on distal penis that has been present for about 6 months.  It increased slightly in size but has now stabilized.  No pain, itching, burning, discharge.  Patient is a 21 y.o. male presenting with rash. The history is provided by the patient.  Rash  This is a new problem. Episode onset: 6 months ago. The problem has not changed since onset.The problem is associated with nothing. There has been no fever. The rash is present on the genitalia. The pain is at a severity of 0/10. The patient is experiencing no pain. Pertinent negatives include no blisters, no itching, no pain and no weeping. Treatments tried: moisturizing cream. The treatment provided no relief.    Past Medical History  Diagnosis Date  . Anxiety     History reviewed. No pertinent past surgical history.  Family History  Problem Relation Age of Onset  . Alcohol abuse Mother     History  Substance Use Topics  . Smoking status: Current Some Day Smoker -- 0.0 packs/day for 2 years  . Smokeless tobacco: Never Used  . Alcohol Use: 0.0 oz/week    .25 drink(s) per week     Drinks about one drink per couple months      Review of Systems  Skin: Positive for rash. Negative for itching.  All other systems reviewed and are negative.    Allergies  Pollen extract  Home Medications   Current Outpatient Rx  Name Route Sig Dispense Refill  . KETOCONAZOLE 2 % EX CREA Topical Apply topically 2 (two) times daily. 15 g 0    BP 131/83  Pulse 73  Temp 97.9 F (36.6 C) (Oral)  Resp 14  Ht 6' (1.829 m)  Wt 174 lb (78.926 kg)  BMI 23.60 kg/m2  SpO2 98%  Physical Exam  Nursing note and vitals reviewed. Constitutional: He is oriented to person, place, and time. He appears well-developed and  well-nourished. No distress.  Eyes: Conjunctivae are normal. Pupils are equal, round, and reactive to light.  Genitourinary: Penis normal.    No penile tenderness.       On the dorsal distal shaft of penis is a 5mm dia. Slightly raised macule that is slightly erythematous, and slightly scaly.  Border is well defined.  No tenderness.  Neurological: He is alert and oriented to person, place, and time.  Skin: Skin is warm and dry.    ED Course  Procedures none      1. Lesion of penis; appears benign but non-specific.  ? Tinea       MDM   Will empirically treat with Nizoral cream. Followup with dermatologist if not resolved in two weeks.        Lattie Haw, MD 04/25/12 [phone]

## 2012-05-15 ENCOUNTER — Telehealth (HOSPITAL_COMMUNITY): Payer: Self-pay

## 2012-05-15 MED ORDER — CLONAZEPAM 0.5 MG PO TABS: 0.2500 mg | ORAL_TABLET | Freq: Two times a day (BID) | ORAL | Status: DC | PRN

## 2012-05-15 NOTE — Telephone Encounter (Signed)
Protocol and low dose Klonopin one-month supply.

## 2012-07-09 ENCOUNTER — Encounter: Payer: Self-pay | Admitting: *Deleted

## 2012-07-09 ENCOUNTER — Emergency Department
Admission: EM | Admit: 2012-07-09 | Discharge: 2012-07-09 | Disposition: A | Discharge status: Home / Self Care | Payer: BC Managed Care – PPO | Source: Home / Self Care

## 2012-07-09 DIAGNOSIS — M7520 Bicipital tendinitis, unspecified shoulder: Secondary | ICD-10-CM

## 2012-07-09 DIAGNOSIS — M654 Radial styloid tenosynovitis [de Quervain]: Secondary | ICD-10-CM

## 2012-07-09 DIAGNOSIS — M7521 Bicipital tendinitis, right shoulder: Secondary | ICD-10-CM

## 2012-07-09 MED ORDER — NAPROXEN 500 MG PO TABS: 500.0000 mg | ORAL_TABLET | Freq: Two times a day (BID) | ORAL | Status: DC

## 2012-07-09 NOTE — ED Provider Notes (Signed)
Agree with exam, assessment, and plan.   Pariss Hommes A Alliana Mcauliff, MD 07/09/12 2032 
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
## 2012-07-09 NOTE — ED Notes (Addendum)
Patient c/o right wrist and arm pain and numbness x 1 month. He has worn a brace without relief. He fractured the right wrist as a child. 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

## 2012-07-30 ENCOUNTER — Ambulatory Visit (INDEPENDENT_AMBULATORY_CARE_PROVIDER_SITE_OTHER): Payer: BC Managed Care – PPO | Admitting: Sports Medicine

## 2012-07-30 ENCOUNTER — Encounter: Payer: Self-pay | Admitting: Sports Medicine

## 2012-07-30 VITALS — BP 134/70 | HR 76 | Resp 18 | Wt 170.0 lb

## 2012-07-30 DIAGNOSIS — M5412 Radiculopathy, cervical region: Secondary | ICD-10-CM

## 2012-07-30 MED ORDER — CYCLOBENZAPRINE HCL 10 MG PO TABS: ORAL_TABLET | ORAL | Status: DC

## 2012-07-30 MED ORDER — PREDNISONE 50 MG PO TABS: ORAL_TABLET | ORAL | Status: DC

## 2012-07-30 NOTE — Progress Notes (Signed)
Subjective:    I'm seeing this patient as a consultation for:  Dr. Cathren Harsh, [place], NP  CC: Right arm numbness and pain  HPI: Luis Lynch is a very pleasant 21 year old male, who was moving some heavy items several weeks ago. He remained with lifting up a large hand truck with his right arm. He never had initial pain, however with the next day he developed pain associated with numbness, and tingling the localized over his lateral upper arm, posterior lateral forearm, and into predominantly his third and fourth fingers. He's also noted some weakness in his hand muscles. He went to the urgent care, and was given some naproxen. Since then, he's been taking occasional hot baths which tend to relax his muscles and relieve his symptoms. He's also been placing ice on his arm. Overall he notes his symptoms are improving, unfortunately are still present. He does note some stiffness in his neck, but no pain. He denies any symptoms in his lower extremities.  Past medical history, Surgical history, Family history, Social history, Allergies, and medications have been entered into the medical record, reviewed, and no changes needed.   Review of Systems: No headache, visual changes, nausea, vomiting, diarrhea, constipation, dizziness, abdominal pain, skin rash, fevers, chills, night sweats, weight loss, body aches, joint swelling, muscle aches, chest pain, or shortness of breath.   Objective:   Vitals:  Afebrile, vital signs stable. General: Well Developed, well nourished, and in no acute distress.  Neuro/Psych: Alert and oriented x3, extra-ocular muscles intact, able to move all 4 extremities.  Skin: Warm and dry, no rashes noted.  Respiratory: Not using accessory muscles, speaking in full sentences, trachea midline.  Cardiovascular: Pulses palpable, no extremity edema. Abdomen: Does not appear distended. Neck: Inspection unremarkable. No palpable stepoffs. Negative Spurling's maneuver. Full neck range of  motion Grip strength and sensation normal in bilateral hands Strength good C4 to T1 distribution, with the exception of a right-sided C7 distribution, with triceps weakness. Interestingly, he also has some mild hand intrinsic weakness. This is predominantly of the abductor pollicis, which also has a C7 innervation. No sensory change to C4 to T1 Negative Hoffman sign bilaterally Reflexes normal  Impression and Recommendations:   This case required medical decision making of moderate complexity.

## 2012-07-30 NOTE — Assessment & Plan Note (Signed)
This is likely in a C7 distribution, and related to her cervical, and scalene spasm. Prednisone, continue naproxen, Flexeril, home rehabilitation. I will see him back in 2-3 weeks to see how he is doing.

## 2012-08-03 ENCOUNTER — Emergency Department
Admission: EM | Admit: 2012-08-03 | Discharge: 2012-08-03 | Disposition: A | Discharge status: Home / Self Care | Payer: BC Managed Care – PPO | Source: Home / Self Care | Attending: Family Medicine | Admitting: Family Medicine

## 2012-08-03 ENCOUNTER — Encounter: Payer: Self-pay | Admitting: Emergency Medicine

## 2012-08-03 DIAGNOSIS — T50905A Adverse effect of unspecified drugs, medicaments and biological substances, initial encounter: Secondary | ICD-10-CM

## 2012-08-03 DIAGNOSIS — T887XXA Unspecified adverse effect of drug or medicament, initial encounter: Secondary | ICD-10-CM

## 2012-08-03 MED ORDER — HYDROXYZINE PAMOATE 25 MG PO CAPS: ORAL_CAPSULE | ORAL | Status: DC

## 2012-08-03 NOTE — ED Provider Notes (Signed)
History     CSN: [id number]  Arrival date & time 08/03/12  1701   First MD Initiated Contact with Patient 08/03/12 1735      Chief Complaint  Patient presents with  . Allergic Reaction      HPI Comments: Patient notes that two days after beginning Flexeril and prednisone he developed a burning rash of face and arms.  He denies difficulty swallowing, wheezing, or shortness of breath.    Patient is a 21 y.o. male presenting with rash. The history is provided by the patient.  Rash  This is a new problem. Episode onset: 2 days ago. The problem has not changed since onset.The problem is associated with new medications. There has been no fever. The rash is present on the face, left arm and right arm. The pain is mild. The pain has been constant since onset. Associated symptoms include pain. Pertinent negatives include no blisters, no itching and no weeping. He has tried nothing for the symptoms. Risk factors include new medications.    Past Medical History  Diagnosis Date  . Anxiety     Past Surgical History  Procedure Date  . Mandible surgery 2004  . Nose surgery   . Leg surgery     left    Family History  Problem Relation Age of Onset  . Alcohol abuse Mother   . Hypertension Mother   . Heart disease Maternal Grandmother   . Cancer Maternal Grandmother     lung  . Cancer Maternal Grandfather     colon  . Hyperlipidemia Paternal Grandmother   . Hypertension Paternal Grandmother   . Diabetes Paternal Grandmother   . Cancer Paternal Grandfather     bladder    History  Substance Use Topics  . Smoking status: Former Smoker -- 1.0 packs/day for 2 years  . Smokeless tobacco: Never Used  . Alcohol Use: 0.0 oz/week    .25 drink(s) per week     Drinks about one drink per couple months      Review of Systems  Skin: Positive for rash. Negative for itching.  All other systems reviewed and are negative.    Allergies  Pollen extract  Home Medications   Current  Outpatient Rx  Name Route Sig Dispense Refill  . CLONAZEPAM 0.5 MG PO TABS Oral Take 0.5 tablets (0.25 mg total) by mouth 2 (two) times daily as needed for anxiety. 30 tablet 0  . CYCLOBENZAPRINE HCL 10 MG PO TABS  One half tab PO qHS, then increase gradually to one tab TID. 30 tablet 0  . HYDROXYZINE PAMOATE 25 MG PO CAPS  Take one cap by mouth 2 or 3 times daily as needed for rash 15 capsule 0  . NAPROXEN 500 MG PO TABS Oral Take 1 tablet (500 mg total) by mouth 2 (two) times daily. 30 tablet 0  . PREDNISONE 50 MG PO TABS  One tab PO daily for 5 days. 5 tablet 0    BP 122/80  Pulse 72  Temp 97.4 F (36.3 C) (Oral)  Resp 16  Ht 5\' 11"  (1.803 m)  Wt 170 lb (77.111 kg)  BMI 23.71 kg/m2  SpO2 16%  Physical Exam Nursing notes and Vital Signs reviewed. Appearance:  Patient appears healthy, stated age, and in no acute distress Eyes:  Pupils are equal, round, and reactive to light and accomodation.  Extraocular movement is intact.  Conjunctivae are not inflamed  Mouth:  No lesions Neck:  Supple.    Lungs:  Clear to auscultation.  Breath sounds are equal.  Heart:  Regular rate and rhythm without murmurs, rubs, or gallops.  Skin:  Mild erythema without tenderness noted on bilateral cheeks/forehead and arms  ED Course  Procedures none      1. Adverse drug reaction       MDM  Rx written for Vistaril. Stop Flexeril and prednisone. Followup with Dr. Rodney Langton for further management of orthopedic problem        Lattie Haw, MD 08/03/12 [id number]

## 2012-08-03 NOTE — ED Notes (Addendum)
Patient believes he is having an allergic reaction to medicine prescribed 07-30-12 by PCP Dr. Lorenda Peck for prednisone and flexeril; has reddened face and feels 'funny'; denies respiratory problems; d/c'd prednisone yesterday.

## 2012-08-05 ENCOUNTER — Telehealth: Payer: Self-pay | Admitting: *Deleted

## 2012-08-07 ENCOUNTER — Ambulatory Visit (INDEPENDENT_AMBULATORY_CARE_PROVIDER_SITE_OTHER): Payer: BC Managed Care – PPO | Admitting: Sports Medicine

## 2012-08-07 ENCOUNTER — Encounter: Payer: Self-pay | Admitting: Sports Medicine

## 2012-08-07 ENCOUNTER — Telehealth: Payer: Self-pay | Admitting: *Deleted

## 2012-08-07 VITALS — BP 124/91 | HR 83 | Temp 98.3°F | Wt 167.0 lb

## 2012-08-07 DIAGNOSIS — M5412 Radiculopathy, cervical region: Secondary | ICD-10-CM

## 2012-08-07 NOTE — Assessment & Plan Note (Signed)
Resolved with conservative measures. He can come back to see me on an as-needed basis for this. He should continue his home rehabilitation exercises.  I do think that the reaction including a skin rash that he had probably has a viral etiology. A few days of prednisone acted as a mild immunosuppressive, he contracted a viral illness that resulted in a maculopapular rash, and now has symptoms of sinusitis. We discussed the options, and I would like him to stop all medicines, but continue his home rehabilitation, and come back to see me on an as-needed basis should the rash persists for 5-7 days. He is agreeable to this.

## 2012-08-07 NOTE — Progress Notes (Signed)
Subjective:    CC: Followup right arm numbness, rash  HPI: I saw Luis Lynch about a week ago in the urgent care as a consult.  He had what appeared to be a right-sided C7 neuropraxia. I placed him on prednisone, on rehabilitation exercises, and some muscle relaxers. He took this for a few days, and his upper extremity symptoms resolved. Unfortunately he developed a rash over his face, as well as arms. The rash was itchy, and he also developed some sinus pressure, and nasal drainage. He he denied any fevers, or chills, but noted the rash felt somewhat like a sunburn. He hasn't tried any other medications. He denies any mucocutaneous involvement.  Past medical history, Surgical history, Family history, Social history, Allergies, and medications have been entered into the medical record, reviewed, and no changes needed.   Review of Systems: No fevers, chills, night sweats, weight loss, chest pain, or shortness of breath.   Objective:    General: Well Developed, well nourished, and in no acute distress.  Neuro: Alert and oriented x3, extra-ocular muscles intact.  HEENT: Normocephalic, atraumatic, pupils equal round reactive to light, neck supple, no masses, no lymphadenopathy, thyroid nonpalpable.  Skin: Face appears somewhat flushed, reddish, and blanches.  He also has a maculopapular rash on both upper extremities but appears to be resolving. Cardiac: Regular rate and rhythm, no murmurs rubs or gallops.  Respiratory: Clear to auscultation bilaterally. Not using accessory muscles, speaking in full sentences.   Impression and Recommendations:

## 2012-08-07 NOTE — Telephone Encounter (Signed)
Given prednisone by you for arm inflammation and had an allergic reaction ( dizzy, confusion, rash on face) seen at [facility] and given Vistaril and states felt worse after given that. States arm is feeling better but wanting to know how long it will take rash to go away and when will the hotness he is feeling go away. Last dose was last Monday

## 2012-08-07 NOTE — Telephone Encounter (Signed)
Pt notified via V/M

## 2012-08-07 NOTE — Telephone Encounter (Signed)
[place] Faster! It its not gone a day or 2 later, I should really lay eyes on it. -T

## 2012-08-12 ENCOUNTER — Telehealth (HOSPITAL_COMMUNITY): Payer: Self-pay

## 2012-08-12 MED ORDER — SERTRALINE HCL 50 MG PO TABS: ORAL_TABLET | ORAL | Status: DC

## 2012-08-12 NOTE — Telephone Encounter (Signed)
Patient was treated with prednisone for a pulled muscle in his back. He went off it, because it induced panic attacks. Patient is still having panic attacks. He is now having insomnia. He was given Vistaril which made things worse. He has also been given some low dose Ativan. Patient is asking to restart Zoloft. I will restart Zoloft at 25 mg daily for 2 weeks then increase to 50 mg daily. He has an appointment for next week. I have advised him to try Benadryl at bedtime for sleep. He may call with concerns.

## 2012-08-19 ENCOUNTER — Ambulatory Visit (HOSPITAL_COMMUNITY): Payer: BC Managed Care – PPO | Admitting: Psychiatry

## 2012-08-28 ENCOUNTER — Encounter: Payer: Self-pay | Admitting: *Deleted

## 2012-08-28 ENCOUNTER — Emergency Department
Admission: EM | Admit: 2012-08-28 | Discharge: 2012-08-28 | Disposition: A | Discharge status: Home / Self Care | Payer: BC Managed Care – PPO | Source: Home / Self Care

## 2012-08-28 DIAGNOSIS — H6090 Unspecified otitis externa, unspecified ear: Secondary | ICD-10-CM

## 2012-08-28 DIAGNOSIS — H6690 Otitis media, unspecified, unspecified ear: Secondary | ICD-10-CM

## 2012-08-28 MED ORDER — NEOMYCIN-POLYMYXIN-HC 3.5-10000-1 OT SOLN: 3.0000 [drp] | Freq: Four times a day (QID) | OTIC | Status: AC

## 2012-08-28 MED ORDER — AMOXICILLIN 500 MG PO TABS: 1000.0000 mg | ORAL_TABLET | Freq: Three times a day (TID) | ORAL | Status: AC

## 2012-08-28 NOTE — ED Provider Notes (Signed)
History     CSN: [id number]  Arrival date & time 08/28/12  1832   None     Chief Complaint  Patient presents with  . Otalgia   HPI EAR PAIN Location:  L ear  Description: Lear pain, fullness  Onset:  1-2 weeks acutely; intermittently over last 1-2 months  Modifying factors: none   Symptoms  Sensation of fullness: yes Ear discharge: no URI symptoms: no  Fever: no Tinnitus:no   Dizziness:no   Hearing loss:no   Toothache: no Rashes or lesions: no Facial muscle weakness: no  Red Flags Recent trauma: no PMH prior ear surgery:  no Diabetes or Immunosuppresion: no    Past Medical History  Diagnosis Date  . Anxiety     Past Surgical History  Procedure Date  . Mandible surgery 2004  . Nose surgery   . Leg surgery     left    Family History  Problem Relation Age of Onset  . Alcohol abuse Mother   . Hypertension Mother   . Heart disease Maternal Grandmother   . Cancer Maternal Grandmother     lung  . Cancer Maternal Grandfather     colon  . Hyperlipidemia Paternal Grandmother   . Hypertension Paternal Grandmother   . Diabetes Paternal Grandmother   . Cancer Paternal Grandfather     bladder    History  Substance Use Topics  . Smoking status: Former Smoker -- 1.0 packs/day for 2 years  . Smokeless tobacco: Never Used  . Alcohol Use: No     Drinks about one drink per couple months      Review of Systems See HPI, otherwise 12 point ROS negative  Allergies  Flexeril; Pollen extract; and Prednisone  Home Medications   Current Outpatient Rx  Name Route Sig Dispense Refill  . CLONAZEPAM 0.5 MG PO TABS Oral Take 0.5 tablets (0.25 mg total) by mouth 2 (two) times daily as needed for anxiety. 30 tablet 0  . CYCLOBENZAPRINE HCL 10 MG PO TABS  One half tab PO qHS, then increase gradually to one tab TID. 30 tablet 0  . HYDROXYZINE PAMOATE 25 MG PO CAPS  Take one cap by mouth 2 or 3 times daily as needed for rash 15 capsule 0  . NAPROXEN 500 MG PO TABS  Oral Take 1 tablet (500 mg total) by mouth 2 (two) times daily. 30 tablet 0  . PREDNISONE 50 MG PO TABS  One tab PO daily for 5 days. 5 tablet 0  . SERTRALINE HCL 50 MG PO TABS  Take 1/2 daily for two weeks then increase to one daily 30 tablet 2    BP 129/89  Pulse 76  Temp 98 F (36.7 C) (Oral)  Resp 16  Ht 5\' 11"  (1.803 m)  Wt 166 lb 12 oz (75.637 kg)  BMI 23.26 kg/m2  SpO2 97%  Physical Exam  Constitutional: He appears well-developed and well-nourished.  HENT:  Head: Normocephalic and atraumatic.  Right Ear: External ear normal.       L ear canal erythema/tenderness to otoscopic evaluation, mild TM bulging and erythema    Eyes: Conjunctivae normal are normal. Pupils are equal, round, and reactive to light.  Neck: Normal range of motion. Neck supple.  Cardiovascular: Normal rate, regular rhythm and normal heart sounds.   Pulmonary/Chest: Effort normal and breath sounds normal.  Musculoskeletal: Normal range of motion.  Neurological: He is alert.  Skin: Skin is warm.    ED Course  Procedures (including critical  care time)  Labs Reviewed - No data to display No results found.   1. Otitis media   2. Otitis externa       MDM  Will place on course of amox at otitis dosing and cortisporin.  Discussed infectious red flags for reevaluation.  Follow up as needed.   Pt also mention R arm pain that he has seen by sports medicine in the past for. No acute worsening. Referred pt back to sports medicine for reevaluation/any further questions. Pt agreeable to this.      The patient and/or caregiver has been counseled thoroughly with regard to treatment plan and/or medications prescribed including dosage, schedule, interactions, rationale for use, and possible side effects and they verbalize understanding. Diagnoses and expected course of recovery discussed and will return if not improved as expected or if the condition worsens. Patient and/or caregiver verbalized  understanding.             Doree Albee, MD 08/28/12 1901

## 2012-08-28 NOTE — ED Notes (Signed)
The pt is here today for LT ear pain on and off for several months. Denies fever. No OTC meds.

## 2012-08-30 ENCOUNTER — Telehealth: Payer: Self-pay

## 2012-08-30 NOTE — ED Notes (Signed)
Rubert states his ear does not hurt as bad. He will call back if needed.

## 2012-09-12 ENCOUNTER — Ambulatory Visit (INDEPENDENT_AMBULATORY_CARE_PROVIDER_SITE_OTHER): Payer: BC Managed Care – PPO | Admitting: Sports Medicine

## 2012-09-12 ENCOUNTER — Encounter: Payer: Self-pay | Admitting: Sports Medicine

## 2012-09-12 VITALS — BP 117/78 | HR 74 | Temp 97.8°F | Ht 71.0 in | Wt 167.0 lb

## 2012-09-12 DIAGNOSIS — H60399 Other infective otitis externa, unspecified ear: Secondary | ICD-10-CM

## 2012-09-12 DIAGNOSIS — M5412 Radiculopathy, cervical region: Secondary | ICD-10-CM

## 2012-09-12 DIAGNOSIS — Z299 Encounter for prophylactic measures, unspecified: Secondary | ICD-10-CM

## 2012-09-12 DIAGNOSIS — Z0000 Encounter for general adult medical examination without abnormal findings: Secondary | ICD-10-CM | POA: Insufficient documentation

## 2012-09-12 DIAGNOSIS — H6090 Unspecified otitis externa, unspecified ear: Secondary | ICD-10-CM | POA: Insufficient documentation

## 2012-09-12 NOTE — Assessment & Plan Note (Addendum)
Declines flu and Tdap, Tdap erroneously entered as given. Will come back in a few months for complete physical, and some blood work.

## 2012-09-12 NOTE — Assessment & Plan Note (Signed)
Clinically resolved after Cortisporin drops.

## 2012-09-12 NOTE — Progress Notes (Signed)
Subjective:    CC: Followup  HPI: Cervical radiculitis: Symptoms completely resolved today.  Otitis externa: Symptoms improving with Cortisporin prescribed at the urgent care.  Preventive care: Due for flu, and Tdap, declines both.  Past medical history, Surgical history, Family history, Social history, Allergies, and medications have been entered into the medical record, reviewed, and no changes needed.   Review of Systems: No fevers, chills, night sweats, weight loss, chest pain, or shortness of breath.   Objective:    General: Well Developed, well nourished, and in no acute distress.  Neuro: Alert and oriented x3, extra-ocular muscles intact.  HEENT: Normocephalic, atraumatic, pupils equal round reactive to light, neck supple, no masses, no lymphadenopathy, thyroid nonpalpable. External canals are unremarkable. Skin: Warm and dry, no rashes. Cardiac: Regular rate and rhythm, no murmurs rubs or gallops.  Respiratory: Clear to auscultation bilaterally. Not using accessory muscles, speaking in full sentences. Neck: Inspection unremarkable. No palpable stepoffs. Negative Spurling's maneuver. Full neck range of motion Grip strength and sensation normal in bilateral hands Strength good C4 to T1 distribution No sensory change to C4 to T1 Negative Hoffman sign bilaterally Reflexes normal  Impression and Recommendations:

## 2012-09-12 NOTE — Assessment & Plan Note (Signed)
Symptoms resolved today. Followup as needed for this. Continue neck rehabilitation exercises.

## 2012-09-22 ENCOUNTER — Encounter: Payer: Self-pay | Admitting: Sports Medicine

## 2012-09-22 ENCOUNTER — Ambulatory Visit (INDEPENDENT_AMBULATORY_CARE_PROVIDER_SITE_OTHER): Payer: BC Managed Care – PPO | Admitting: Sports Medicine

## 2012-09-22 VITALS — BP 131/88 | HR 80 | Wt 163.0 lb

## 2012-09-22 DIAGNOSIS — Z299 Encounter for prophylactic measures, unspecified: Secondary | ICD-10-CM

## 2012-09-22 DIAGNOSIS — Z23 Encounter for immunization: Secondary | ICD-10-CM

## 2012-09-22 DIAGNOSIS — B9789 Other viral agents as the cause of diseases classified elsewhere: Secondary | ICD-10-CM

## 2012-09-22 DIAGNOSIS — B349 Viral infection, unspecified: Secondary | ICD-10-CM | POA: Insufficient documentation

## 2012-09-22 MED ORDER — PROMETHAZINE HCL 25 MG PO TABS: 25.0000 mg | ORAL_TABLET | Freq: Four times a day (QID) | ORAL | Status: DC | PRN

## 2012-09-22 NOTE — Assessment & Plan Note (Signed)
Patient declines flu shots, he will get his Tdap today.

## 2012-09-22 NOTE — Progress Notes (Signed)
Subjective:     Patient ID: Luis Lynch, male   DOB: Mar 23, 1991, 21 y.o.   MRN: [id number]  HPI  Patient is a 21 yo male presenting with a 1 week history of nausea, sore throat, congestion, cough and fatigue.   Patient states that he started having a sore throat with congestion 7 days ago. It was so bad that he stayed in bed for 2 straight days. He took afrin and used cough drops which helped. After 2 days he returned to work. A few days later he again started to feel congested and tired.   Last night (09/21/12) patient had 5 episodes of vomiting. Has since been able to eat food without vomiting but has developed a dry cough. Symptoms are moderate.  Past medical history, Surgical history, Family history, Social history, Allergies, and medications have been entered into the medical record, reviewed, and no changes needed.   Review of Systems  Constitutional: Positive for chills and fatigue. Negative for fever.  HENT: Positive for congestion, rhinorrhea, postnasal drip and sinus pressure.   Eyes: Negative.   Respiratory: Negative.   Cardiovascular: Negative.   Gastrointestinal: Negative.   Genitourinary: Negative.   Musculoskeletal: Negative.   Skin: Negative.   Neurological: Negative.   Hematological: Negative.   Psychiatric/Behavioral: Negative.       Objective:   Physical Exam  Constitutional: He is oriented to person, place, and time. He appears well-developed and well-nourished.  HENT:  Head: Normocephalic and atraumatic.  Right Ear: External ear normal.  Left Ear: External ear normal.  Mouth/Throat: Oropharynx is clear and moist. No oropharyngeal exudate.       Boggy turbinates  Eyes: Conjunctivae normal and EOM are normal. Pupils are equal, round, and reactive to light.  Neck: Normal range of motion. Neck supple.  Cardiovascular: Normal rate, regular rhythm, normal heart sounds and intact distal pulses.   Pulmonary/Chest: Effort normal and breath sounds normal.   Abdominal: Soft. Bowel sounds are normal.  Musculoskeletal: Normal range of motion.  Neurological: He is alert and oriented to person, place, and time.  Skin: Skin is warm and dry.      Assessment/plan:

## 2012-09-22 NOTE — Assessment & Plan Note (Signed)
DayQuil for symptoms. Phenergan for nausea. Return to clinic for complete physical exam.

## 2012-12-23 ENCOUNTER — Ambulatory Visit (INDEPENDENT_AMBULATORY_CARE_PROVIDER_SITE_OTHER): Payer: BC Managed Care – PPO | Admitting: Sports Medicine

## 2012-12-23 ENCOUNTER — Encounter: Payer: Self-pay | Admitting: Sports Medicine

## 2012-12-23 VITALS — BP 130/90 | HR 88 | Temp 97.8°F | Wt 161.0 lb

## 2012-12-23 DIAGNOSIS — R05 Cough: Secondary | ICD-10-CM | POA: Insufficient documentation

## 2012-12-23 DIAGNOSIS — R059 Cough, unspecified: Secondary | ICD-10-CM | POA: Insufficient documentation

## 2012-12-23 DIAGNOSIS — J209 Acute bronchitis, unspecified: Secondary | ICD-10-CM

## 2012-12-23 DIAGNOSIS — J208 Acute bronchitis due to other specified organisms: Secondary | ICD-10-CM

## 2012-12-23 MED ORDER — FLUTICASONE PROPIONATE 50 MCG/ACT NA SUSP: NASAL | Status: DC

## 2012-12-23 MED ORDER — BENZONATATE 200 MG PO CAPS: 200.0000 mg | ORAL_CAPSULE | Freq: Three times a day (TID) | ORAL | Status: DC | PRN

## 2012-12-23 NOTE — Progress Notes (Signed)
Subjective:    CC: Sick  HPI: Makih comes in with a one-week history of cough that is nonproductive but wet sounding, mild runny nose, no sore throat, no GI symptoms, no rash. He does work at a [employer] and is exposed to multiple sick people in a daily basis. He denies any shortness of breath, denies any abdominal pain. He denies any sinus pain or pressure. Symptoms are moderate, and stable.  Past medical history, Surgical history, Family history not pertinant except as noted below, Social history, Allergies, and medications have been entered into the medical record, reviewed, and no changes needed.   Review of Systems: No fevers, chills, night sweats, weight loss, chest pain, or shortness of breath.   Objective:    General: Well Developed, well nourished, and in no acute distress.  Neuro: Alert and oriented x3, extra-ocular muscles intact, sensation grossly intact.  HEENT: Normocephalic, atraumatic, pupils equal round reactive to light, neck supple, no masses, no lymphadenopathy, thyroid nonpalpable. Oropharynx, extremity or canals are unremarkable, nasopharynx shows the right with deviated septum but otherwise negative. Skin: Warm and dry, no rashes. Cardiac: Regular rate and rhythm, no murmurs rubs or gallops.  Respiratory: Clear to auscultation bilaterally. Not using accessory muscles, speaking in full sentences. Impression and Recommendations:

## 2012-12-23 NOTE — Assessment & Plan Note (Signed)
Exam is fairly benign. We will start conservatively with Tessalon Perles, and Flonase. Come back to see [place] on an as needed basis.

## 2012-12-23 NOTE — Patient Instructions (Addendum)
Bronchitis Bronchitis is the body's way of reacting to injury and/or infection (inflammation) of the bronchi. Bronchi are the air tubes that extend from the windpipe into the lungs. If the inflammation becomes severe, it may cause shortness of breath. CAUSES  Inflammation may be caused by:  A virus.  Germs (bacteria).  Dust.  Allergens.  Pollutants and many other irritants. The cells lining the bronchial tree are covered with tiny hairs (cilia). These constantly beat upward, away from the lungs, toward the mouth. This keeps the lungs free of pollutants. When these cells become too irritated and are unable to do their job, mucus begins to develop. This causes the characteristic cough of bronchitis. The cough clears the lungs when the cilia are unable to do their job. Without either of these protective mechanisms, the mucus would settle in the lungs. Then you would develop pneumonia. Smoking is a common cause of bronchitis and can contribute to pneumonia. Stopping this habit is the single most important thing you can do to help yourself. TREATMENT   Your caregiver may prescribe an antibiotic if the cough is caused by bacteria. Also, medicines that open up your airways make it easier to breathe. Your caregiver may also recommend or prescribe an expectorant. It will loosen the mucus to be coughed up. Only take over-the-counter or prescription medicines for pain, discomfort, or fever as directed by your caregiver.  Removing whatever causes the problem (smoking, for example) is critical to preventing the problem from getting worse.  Cough suppressants may be prescribed for relief of cough symptoms.  Inhaled medicines may be prescribed to help with symptoms now and to help prevent problems from returning.  For those with recurrent (chronic) bronchitis, there may be a need for steroid medicines. SEEK IMMEDIATE MEDICAL CARE IF:   During treatment, you develop more pus-like mucus (purulent  sputum).  You have a fever.  Your baby is older than 3 months with a rectal temperature of 102 F (38.9 C) or higher.  Your baby is 3 months old or younger with a rectal temperature of 100.4 F (38 C) or higher.  You become progressively more ill.  You have increased difficulty breathing, wheezing, or shortness of breath. It is necessary to seek immediate medical care if you are elderly or sick from any other disease. MAKE SURE YOU:   Understand these instructions.  Will watch your condition.  Will get help right away if you are not doing well or get worse. Document Released: 10/22/2005 Document Revised: 01/14/2012 Document Reviewed: 08/31/2008 Exit[employer] Patient Information 2013 [employer].  

## 2013-11-24 ENCOUNTER — Encounter: Payer: Self-pay | Admitting: Sports Medicine

## 2013-11-24 ENCOUNTER — Ambulatory Visit (INDEPENDENT_AMBULATORY_CARE_PROVIDER_SITE_OTHER): Payer: BC Managed Care – PPO | Admitting: Sports Medicine

## 2013-11-24 VITALS — BP 135/82 | HR 92 | Wt 156.0 lb

## 2013-11-24 DIAGNOSIS — F3289 Other specified depressive episodes: Secondary | ICD-10-CM

## 2013-11-24 DIAGNOSIS — F411 Generalized anxiety disorder: Secondary | ICD-10-CM

## 2013-11-24 DIAGNOSIS — F329 Major depressive disorder, single episode, unspecified: Secondary | ICD-10-CM

## 2013-11-24 MED ORDER — VENLAFAXINE HCL ER 37.5 MG PO CP24: 37.5000 mg | ORAL_CAPSULE | Freq: Every day | ORAL | Status: DC

## 2013-11-24 NOTE — Progress Notes (Signed)
  Subjective:    CC: Followup  HPI: This pleasant 23 year old male has a history of anxiety, depression, and excessive compulsive disorder. He was seeing our child psychiatrist downstairs, she has switched practices, and he needs further treatment. He has been on Zoloft in the recent past, took it for several days, felt tired and discontinued. Currently he is feeling depressed mood, difficulty sleeping, lack of interest. He denies any suicidal or homicidal ideation. He is interested in pharmacologic and psychotherapeutic intervention. Symptoms are moderate, persistent. He does tell me he has numbness in both arms, as well as his penis.  Past medical history, Surgical history, Family history not pertinant except as noted below, Social history, Allergies, and medications have been entered into the medical record, reviewed, and no changes needed.   Review of Systems: No fevers, chills, night sweats, weight loss, chest pain, or shortness of breath.   Objective:    General: Well Developed, well nourished, and in no acute distress.  Neuro: Alert and oriented x3, extra-ocular muscles intact, sensation grossly intact.  HEENT: Normocephalic, atraumatic, pupils equal round reactive to light, neck supple, no masses, no lymphadenopathy, thyroid nonpalpable.  Skin: Warm and dry, no rashes. Cardiac: Regular rate and rhythm, no murmurs rubs or gallops, no lower extremity edema.  Respiratory: Clear to auscultation bilaterally. Not using accessory muscles, speaking in full sentences.  Impression and Recommendations:

## 2013-11-24 NOTE — Assessment & Plan Note (Signed)
Persistent symptoms of depression, anxiety. He does need an activating antidepressant, we are going to start venlafaxine 37.5 mg. I'm also going to send him downstairs for consideration of psychotherapy. To see him back in 2 weeks, to see how he's doing.

## 2014-01-20 ENCOUNTER — Ambulatory Visit (INDEPENDENT_AMBULATORY_CARE_PROVIDER_SITE_OTHER): Payer: BC Managed Care – PPO

## 2014-01-20 ENCOUNTER — Ambulatory Visit (INDEPENDENT_AMBULATORY_CARE_PROVIDER_SITE_OTHER): Payer: BC Managed Care – PPO | Admitting: Sports Medicine

## 2014-01-20 ENCOUNTER — Encounter: Payer: Self-pay | Admitting: Sports Medicine

## 2014-01-20 VITALS — BP 124/84 | HR 71 | Temp 98.7°F | Ht 72.0 in | Wt 159.0 lb

## 2014-01-20 DIAGNOSIS — R05 Cough: Secondary | ICD-10-CM

## 2014-01-20 DIAGNOSIS — R0602 Shortness of breath: Secondary | ICD-10-CM

## 2014-01-20 DIAGNOSIS — R059 Cough, unspecified: Secondary | ICD-10-CM

## 2014-01-20 MED ORDER — AZITHROMYCIN 250 MG PO TABS: ORAL_TABLET | ORAL | Status: DC

## 2014-01-20 MED ORDER — BENZONATATE 200 MG PO CAPS: 200.0000 mg | ORAL_CAPSULE | Freq: Three times a day (TID) | ORAL | Status: DC | PRN

## 2014-01-20 NOTE — Progress Notes (Signed)
  Subjective:    CC: Cough  HPI: For the past one month Luis Lynch. has had a persistent cough, he gets occasional sweats at night. No weight loss. There were constitutional symptoms, no GI symptoms. Cough is nonproductive. He does work in an assisted living facility. Symptoms are moderate, persistent.  Past medical history, Surgical history, Family history not pertinant except as noted below, Social history, Allergies, and medications have been entered into the medical record, reviewed, and no changes needed.   Review of Systems: No fevers, chills, night sweats, weight loss, chest pain, or shortness of breath.   Objective:    General: Well Developed, well nourished, and in no acute distress.  Neuro: Alert and oriented x3, extra-ocular muscles intact, sensation grossly intact.  HEENT: Normocephalic, atraumatic, pupils equal round reactive to light, neck supple, no masses, no lymphadenopathy, thyroid nonpalpable. Oropharynx, nasopharynx, external ear canals are unremarkable. Skin: Warm and dry, no rashes. Cardiac: Regular rate and rhythm, no murmurs rubs or gallops, no lower extremity edema.  Respiratory: Coarse/crackles heard in both mid lung fields. Not using accessory muscles, speaking in full sentences.  Impression and Recommendations:

## 2014-01-20 NOTE — Assessment & Plan Note (Signed)
Present now for one month, abnormal lung exam. 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 Return if no better in 2 weeks.

## 2014-01-22 LAB — QUANTIFERON TB GOLD ASSAY (BLOOD)
Interferon Gamma Release Assay: NEGATIVE
Mitogen value: >10 IU/mL
Quantiferon Nil Value: 0.02 IU/mL
Quantiferon Tb Ag Minus Nil Value: 0 IU/mL
TB Ag value: 0.02 IU/mL

## 2014-01-28 ENCOUNTER — Encounter: Payer: Self-pay | Admitting: Sports Medicine

## 2014-01-28 ENCOUNTER — Ambulatory Visit (INDEPENDENT_AMBULATORY_CARE_PROVIDER_SITE_OTHER): Payer: BC Managed Care – PPO | Admitting: Sports Medicine

## 2014-01-28 ENCOUNTER — Telehealth: Payer: Self-pay | Admitting: *Deleted

## 2014-01-28 VITALS — BP 122/84 | HR 70 | Ht 71.0 in | Wt 159.0 lb

## 2014-01-28 DIAGNOSIS — R05 Cough: Secondary | ICD-10-CM

## 2014-01-28 DIAGNOSIS — J0100 Acute maxillary sinusitis, unspecified: Secondary | ICD-10-CM

## 2014-01-28 DIAGNOSIS — R059 Cough, unspecified: Secondary | ICD-10-CM

## 2014-01-28 MED ORDER — AMOXICILLIN-POT CLAVULANATE 875-125 MG PO TABS: 1.0000 | ORAL_TABLET | Freq: Two times a day (BID) | ORAL | Status: DC

## 2014-01-28 MED ORDER — BECLOMETHASONE DIPROP MONOHYD 42 MCG/SPRAY NA SUSP: 2.0000 | Freq: Two times a day (BID) | NASAL | Status: DC
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
## 2014-01-28 NOTE — Assessment & Plan Note (Signed)
Present now for greater than 6 weeks with abnormal lung exam, we are going to proceed with CT of the chest as well as spirometry, pre-and post bronchodilator. He will return at my next available spot for pre-and postbronchodilator spirometry and we were on approval of his CT scan.

## 2014-01-28 NOTE — Telephone Encounter (Signed)
Per [employer] no precert needed for CT Chest w/o contrast. Barry DienesKimberly Gordon, LPN
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
## 2014-01-29 ENCOUNTER — Telehealth: Payer: Self-pay | Admitting: Sports Medicine

## 2014-01-29 DIAGNOSIS — J0100 Acute maxillary sinusitis, unspecified: Secondary | ICD-10-CM

## 2014-01-29 MED ORDER — BECLOMETHASONE DIPROP MONOHYD 42 MCG/SPRAY NA SUSP: 2.0000 | Freq: Two times a day (BID) | NASAL | Status: DC

## 2014-01-29 NOTE — Telephone Encounter (Signed)
Patient needs written script

## 2014-01-29 NOTE — Telephone Encounter (Signed)
Patient attempted to take the pharmacy, they had to order it, he wants another copy to take to a different pharmacy in the hopes that they have it in stock.

## 2014-02-01 ENCOUNTER — Other Ambulatory Visit: Payer: Self-pay | Admitting: Sports Medicine

## 2014-02-03 ENCOUNTER — Ambulatory Visit: Payer: Self-pay | Admitting: Sports Medicine

## 2014-03-02 ENCOUNTER — Ambulatory Visit: Payer: Self-pay | Admitting: Sports Medicine

## 2014-03-03 ENCOUNTER — Emergency Department (INDEPENDENT_AMBULATORY_CARE_PROVIDER_SITE_OTHER): Payer: BC Managed Care – PPO

## 2014-03-03 ENCOUNTER — Encounter: Payer: Self-pay | Admitting: Emergency Medicine

## 2014-03-03 ENCOUNTER — Emergency Department
Admission: EM | Admit: 2014-03-03 | Discharge: 2014-03-03 | Disposition: A | Discharge status: Home / Self Care | Payer: BC Managed Care – PPO | Source: Home / Self Care | Attending: Family Medicine | Admitting: Family Medicine

## 2014-03-03 DIAGNOSIS — J3489 Other specified disorders of nose and nasal sinuses: Secondary | ICD-10-CM

## 2014-03-03 DIAGNOSIS — J309 Allergic rhinitis, unspecified: Secondary | ICD-10-CM

## 2014-03-03 DIAGNOSIS — R51 Headache: Secondary | ICD-10-CM

## 2014-03-03 MED ORDER — DEXAMETHASONE 0.5 MG PO TABS: 0.5000 mg | ORAL_TABLET | Freq: Two times a day (BID) | ORAL | Status: DC

## 2014-03-03 MED ORDER — CICLOPIROX 8 % EX SOLN: Freq: Every day | CUTANEOUS | Status: DC

## 2014-03-03 MED ORDER — DOXYCYCLINE HYCLATE 100 MG PO CAPS: 100.0000 mg | ORAL_CAPSULE | Freq: Two times a day (BID) | ORAL | Status: DC

## 2014-03-03 NOTE — ED Provider Notes (Signed)
CSN: [id number]     Arrival date & time 03/03/14  1540 History   First MD Initiated Contact with Patient 03/03/14 [phone]     Chief Complaint  Patient presents with  . Nasal Congestion      HPI Comments: Patient reports that he had a URI about 3 months ago, and has had persistent sinus congestion and facial pressure for 1.5 months.  He states that he finished a course of antibiotic two weeks ago, but his symptoms persist.  He states that he has an ENT appt next week.  He has had poor response to antihistamines, Afrin spray, and Flonase (however, he only used Flonase for a short period).  He is presently taking Claritin D.  The history is provided by the patient.    Past Medical History  Diagnosis Date  . Anxiety    Past Surgical History  Procedure Laterality Date  . Mandible surgery  2004  . Nose surgery    . Leg surgery      left   Family History  Problem Relation Age of Onset  . Alcohol abuse Mother   . Hypertension Mother   . Heart disease Maternal Grandmother   . Cancer Maternal Grandmother     lung  . Cancer Maternal Grandfather     colon  . Hyperlipidemia Paternal Grandmother   . Hypertension Paternal Grandmother   . Diabetes Paternal Grandmother   . Cancer Paternal Grandfather     bladder   History  Substance Use Topics  . Smoking status: Former Smoker -- 1.00 packs/day for 2 years  . Smokeless tobacco: Never Used  . Alcohol Use: No     Comment: Drinks about one drink per couple months    Review of Systems No sore throat + cough No pleuritic pain No wheezing + nasal congestion + post-nasal drainage + sinus pain/pressure No itchy/red eyes No earache No hemoptysis No SOB No fever/chills No nausea No vomiting No abdominal pain No diarrhea No urinary symptoms No skin rash No fatigue No myalgias + headache Used OTC meds without relief  Allergies  Flexeril; Pollen extract; and Prednisone  Home Medications   Prior to Admission medications    Medication Sig Start Date End Date Taking? Authorizing Provider  amoxicillin-clavulanate (AUGMENTIN) 875-125 MG per tablet Take 1 tablet by mouth 2 (two) times daily. 01/28/14   Monica Bectonhomas J Thekkekandam, MD  beclomethasone (BECONASE-AQ) 42 MCG/SPRAY nasal spray Place 2 sprays into both nostrils 2 (two) times daily. Dose is for each nostril. 01/29/14   Monica Bectonhomas J Thekkekandam, MD  benzonatate (TESSALON) 200 MG capsule Take 1 capsule (200 mg total) by mouth 3 (three) times daily as needed for cough. 01/20/14   Monica Bectonhomas J Thekkekandam, MD  venlafaxine XR (EFFEXOR XR) 37.5 MG 24 hr capsule Take 1 capsule (37.5 mg total) by mouth daily with breakfast. 11/24/13   Monica Bectonhomas J Thekkekandam, MD   BP 126/84  Temp(Src) 97.7 F (36.5 C) (Oral)  Ht 5\' 11"  (1.803 m)  Wt 165 lb (74.844 kg)  BMI 23.02 kg/m2  SpO2 98% Physical Exam Nursing notes and Vital Signs reviewed. Appearance:  Patient appears healthy, stated age, and in no acute distress Eyes:  Pupils are equal, round, and reactive to light and accomodation.  Extraocular movement is intact.  Conjunctivae are not inflamed  Ears:  Canals normal.  Tympanic membranes normal.  Nose:  Congested turbinates.  Maxillary sinus tenderness is present.  Pharynx:  Normal Neck:  Supple.  No adenopathy Lungs:  Clear to  auscultation.  Breath sounds are equal.  Heart:  Regular rate and rhythm without murmurs, rubs, or gallops.  Abdomen:  Nontender without masses or hepatosplenomegaly.  Bowel sounds are present.  No CVA or flank tenderness.  Skin:  No rash present.   ED Course  Procedures  None   Imaging Review Dg Sinuses Complete  03/03/2014   CLINICAL DATA:  Nasal congestion, facial pain.  EXAM: PARANASAL SINUSES - COMPLETE 3 + VIEW  COMPARISON:  None.  FINDINGS: The paranasal sinus are aerated. There is no evidence of sinus opacification air-fluid levels or mucosal thickening. No significant bone abnormalities are seen.  IMPRESSION: Negative.   Electronically Signed   By:  Charlett Nose M.D.   On: 03/03/2014 17:19     MDM         Diagnosis:  Rhinitis.   Begin Dexamethasone burst (patient states that he does not actually have allergy to prednisone.  He states that he merely gets a flushed face). Use Afrin spray for about 5 more days then discontinue.  Continue Flonase, 2 sprays in each nostril once daily.  May continue Claritin D.       Followup with ENT.  Lattie HawStephen A Mykal Kirchman, MD 03/09/14 [phone]

## 2014-03-03 NOTE — ED Notes (Signed)
2&1/2 months ago had a cold, lingering cough, now sinus pain, pressure, congestion, headache. Has tried several OTC meds with no results

## 2014-03-03 NOTE — Discharge Instructions (Signed)
Use Afrin spray for about 5 more days then discontinue.  Continue Flonase, 2 sprays in each nostril once daily.  May continue Claritin D.

## 2014-03-06 ENCOUNTER — Emergency Department
Admission: EM | Admit: 2014-03-06 | Discharge: 2014-03-06 | Disposition: A | Discharge status: Home / Self Care | Payer: BC Managed Care – PPO | Source: Home / Self Care | Attending: Emergency Medicine | Admitting: Emergency Medicine

## 2014-03-06 ENCOUNTER — Encounter: Payer: Self-pay | Admitting: Emergency Medicine

## 2014-03-06 DIAGNOSIS — J45901 Unspecified asthma with (acute) exacerbation: Secondary | ICD-10-CM

## 2014-03-06 MED ORDER — METHYLPREDNISOLONE ACETATE 80 MG/ML IJ SUSP
80.0000 mg | Freq: Once | INTRAMUSCULAR | Status: AC
  Administered 2014-03-06: 80 mg via INTRAMUSCULAR

## 2014-03-06 MED ORDER — IPRATROPIUM-ALBUTEROL 0.5-2.5 (3) MG/3ML IN SOLN
3.0000 mL | Freq: Once | RESPIRATORY_TRACT | Status: AC
  Administered 2014-03-06: 3 mL via RESPIRATORY_TRACT

## 2014-03-06 MED ORDER — ALBUTEROL SULFATE HFA 108 (90 BASE) MCG/ACT IN AERS: 2.0000 | INHALATION_SPRAY | RESPIRATORY_TRACT | Status: DC | PRN

## 2014-03-06 NOTE — ED Provider Notes (Signed)
CSN: [id number]     Arrival date & time 03/06/14  1256 History   First MD Initiated Contact with Patient 03/06/14 1313     Chief Complaint  Patient presents with  . Shortness of Breath   (Consider location/radiation/quality/duration/timing/severity/associated sxs/prior Treatment) HPI Was seen by Dr. Cathren HarshBeese here in urgent care 3 days ago for allergic sinusitis with negative sinus x-rays. Was started on Flonase and that's helped his sinus congestion and clear rhinorrhea somewhat, but still has chronic nasal congestion deviated septum and he states he has appointment with ENT later this week. He was also prescribed oral Decadron burst by Dr. Cathren HarshBeese, but patient has not filled it yet.  His chief complaint this 2 months of mild wheezing, shortness of breath, feels chest tightness, sometimes worse at night associated with cough productive of clearish sputum without any discoloration. He feels he may have mild asthma, but was never diagnosed with this. Symptoms exacerbated when he is around the cats they have at home, and exacerbated by secondhand smoke. Symptoms flared up last night.  Denies exertional chest pain. No fever or chills. No nausea or vomiting.  Family history pertinent for brother with asthma. Past Medical History  Diagnosis Date  . Anxiety    Past Surgical History  Procedure Laterality Date  . Mandible surgery  2004  . Nose surgery    . Leg surgery      left   Family History  Problem Relation Age of Onset  . Alcohol abuse Mother   . Hypertension Mother   . Heart disease Maternal Grandmother   . Cancer Maternal Grandmother     lung  . Cancer Maternal Grandfather     colon  . Hyperlipidemia Paternal Grandmother   . Hypertension Paternal Grandmother   . Diabetes Paternal Grandmother   . Cancer Paternal Grandfather     bladder  . Asthma Brother    History  Substance Use Topics  . Smoking status: Former Smoker -- 1.00 packs/day for 2 years  . Smokeless tobacco: Never  Used  . Alcohol Use: No     Comment: Drinks about one drink per couple months    Review of Systems  All other systems reviewed and are negative.   Allergies  Flexeril; Pollen extract; and Prednisone He denies true allergy to prednisone, and once had some facial flushing from prednisone but no other side effects  Home Medications   Prior to Admission medications   Medication Sig Start Date End Date Taking? Authorizing Provider  beclomethasone (BECONASE-AQ) 42 MCG/SPRAY nasal spray Place 2 sprays into both nostrils 2 (two) times daily. Dose is for each nostril. 01/29/14   Monica Bectonhomas J Thekkekandam, MD  dexamethasone (DECADRON) 0.5 MG tablet Take 1 tablet (0.5 mg total) by mouth 2 (two) times daily with a meal. 03/03/14   Lattie HawStephen A Beese, MD  venlafaxine XR (EFFEXOR XR) 37.5 MG 24 hr capsule Take 1 capsule (37.5 mg total) by mouth daily with breakfast. 11/24/13   Monica Bectonhomas J Thekkekandam, MD   BP 135/84  Pulse 75  Temp(Src) 98.1 F (36.7 C) (Oral)  Ht 5\' 11"  (1.803 m)  Wt 156 lb 12 oz (71.101 kg)  BMI 21.87 kg/m2  SpO2 100% Physical Exam  Nursing note and vitals reviewed. Constitutional: He is oriented to person, place, and time. He appears well-developed and well-nourished. No distress.  HENT:  Head: Normocephalic and atraumatic.  Right Ear: Tympanic membrane, external ear and ear canal normal.  Left Ear: Tympanic membrane, external ear and ear canal  normal.  Nose: Mucosal edema, rhinorrhea and nasal deformity (Deviated septum) present. Right sinus exhibits no maxillary sinus tenderness. Left sinus exhibits no maxillary sinus tenderness.  Mouth/Throat: Oropharynx is clear and moist. No oral lesions. No oropharyngeal exudate.  Eyes: Right eye exhibits no discharge. Left eye exhibits no discharge. No scleral icterus.  Neck: Neck supple.  Cardiovascular: Normal rate, regular rhythm and normal heart sounds.   Pulmonary/Chest: Effort normal. No respiratory distress. He has wheezes ( late  expiratory). He has no rhonchi. He has no rales.  Lymphadenopathy:    He has no cervical adenopathy.  Neurological: He is alert and oriented to person, place, and time.  Skin: Skin is warm and dry.    ED Course  Procedures (including critical care time) Labs Review Labs Reviewed - No data to display  Imaging Review No results found.   MDM   1. Asthma with acute exacerbation    Treatment options discussed, as well as risks, benefits, alternatives. Patient voiced understanding and agreement with the following plans:  DuoNeb nebulizer treatment given. Wheezing improved. Depo-Medrol 80 mg IM Go ahead and start taking the dexamethasone oral prescription prescribed 3 days ago by Dr. Cathren HarshBeese Albuterol HFA rescue inhaler rx,d.  Patient declined chest x-ray.  Follow-up with your primary care doctor in 5-7 days if not improving, or sooner if symptoms become worse. Precautions discussed. Red flags discussed. Questions invited and answered. Patient voiced understanding and agreement.      Lajean Manesavid Massey, MD 03/06/14 1438

## 2014-03-06 NOTE — ED Notes (Signed)
Pt states he has had asthma like attacks for 3 months.  He states they are periods of SOB when he is around a trigger like cats or cigarette smoke.  Also experiences some chest tightness and the SOB is described as air not getting deep into his lungs.  States no wheezing.

## 2014-03-18 ENCOUNTER — Other Ambulatory Visit: Payer: Self-pay | Admitting: Otolaryngology

## 2014-03-24 ENCOUNTER — Encounter (HOSPITAL_BASED_OUTPATIENT_CLINIC_OR_DEPARTMENT_OTHER): Payer: Self-pay | Admitting: *Deleted

## 2014-03-30 ENCOUNTER — Encounter (HOSPITAL_BASED_OUTPATIENT_CLINIC_OR_DEPARTMENT_OTHER): Payer: BC Managed Care – PPO | Admitting: Anesthesiology

## 2014-03-30 ENCOUNTER — Encounter (HOSPITAL_BASED_OUTPATIENT_CLINIC_OR_DEPARTMENT_OTHER)
Admission: RE | Disposition: A | Discharge status: Home / Self Care | Payer: Self-pay | Source: Ambulatory Visit | Attending: Otolaryngology

## 2014-03-30 ENCOUNTER — Ambulatory Visit (HOSPITAL_BASED_OUTPATIENT_CLINIC_OR_DEPARTMENT_OTHER)
Admission: RE | Admit: 2014-03-30 | Discharge: 2014-03-30 | Disposition: A | Discharge status: Home / Self Care | Payer: BC Managed Care – PPO | Source: Ambulatory Visit | Attending: Otolaryngology | Admitting: Otolaryngology

## 2014-03-30 ENCOUNTER — Encounter (HOSPITAL_BASED_OUTPATIENT_CLINIC_OR_DEPARTMENT_OTHER): Payer: Self-pay | Admitting: *Deleted

## 2014-03-30 ENCOUNTER — Ambulatory Visit (HOSPITAL_BASED_OUTPATIENT_CLINIC_OR_DEPARTMENT_OTHER): Payer: BC Managed Care – PPO | Admitting: Anesthesiology

## 2014-03-30 DIAGNOSIS — Z87891 Personal history of nicotine dependence: Secondary | ICD-10-CM | POA: Insufficient documentation

## 2014-03-30 DIAGNOSIS — J343 Hypertrophy of nasal turbinates: Secondary | ICD-10-CM | POA: Insufficient documentation

## 2014-03-30 DIAGNOSIS — J342 Deviated nasal septum: Secondary | ICD-10-CM | POA: Insufficient documentation

## 2014-03-30 DIAGNOSIS — Z9889 Other specified postprocedural states: Secondary | ICD-10-CM

## 2014-03-30 HISTORY — DX: Other specified postprocedural states: Z98.890

## 2014-03-30 HISTORY — DX: Nausea with vomiting, unspecified: R11.2

## 2014-03-30 HISTORY — DX: Hypertrophy of nasal turbinates: J34.3

## 2014-03-30 HISTORY — PX: NASAL SEPTOPLASTY W/ TURBINOPLASTY: SHX2070

## 2014-03-30 LAB — POCT HEMOGLOBIN-HEMACUE: Hemoglobin: 16.7 g/dL (ref 13.0–17.0)

## 2014-03-30 SURGERY — SEPTOPLASTY, NOSE, WITH NASAL TURBINATE REDUCTION
Anesthesia: General | Site: Nose | Laterality: Bilateral

## 2014-03-30 MED ORDER — OXYCODONE HCL 5 MG PO TABS: 5.0000 mg | ORAL_TABLET | Freq: Once | ORAL | Status: DC | PRN

## 2014-03-30 MED ORDER — HYDROMORPHONE HCL PF 1 MG/ML IJ SOLN
0.2500 mg | INTRAMUSCULAR | Status: DC | PRN
  Administered 2014-03-30 (×2): 0.5 mg via INTRAVENOUS
  Administered 2014-03-30: 0.25 mg via INTRAVENOUS

## 2014-03-30 MED ORDER — MUPIROCIN 2 % EX OINT
TOPICAL_OINTMENT | CUTANEOUS | Status: DC | PRN
  Administered 2014-03-30: 1 via NASAL

## 2014-03-30 MED ORDER — PROPOFOL 10 MG/ML IV BOLUS
INTRAVENOUS | Status: DC | PRN
  Administered 2014-03-30: 200 mg via INTRAVENOUS
  Administered 2014-03-30: 100 mg via INTRAVENOUS

## 2014-03-30 MED ORDER — DEXAMETHASONE SODIUM PHOSPHATE 4 MG/ML IJ SOLN
INTRAMUSCULAR | Status: DC | PRN
  Administered 2014-03-30: 10 mg via INTRAVENOUS

## 2014-03-30 MED ORDER — AMOXICILLIN 875 MG PO TABS: 875.0000 mg | ORAL_TABLET | Freq: Two times a day (BID) | ORAL | Status: DC

## 2014-03-30 MED ORDER — FENTANYL CITRATE 0.05 MG/ML IJ SOLN
INTRAMUSCULAR | Status: DC | PRN
  Administered 2014-03-30: 100 ug via INTRAVENOUS
  Administered 2014-03-30 (×2): 50 ug via INTRAVENOUS

## 2014-03-30 MED ORDER — HYDROMORPHONE HCL PF 1 MG/ML IJ SOLN
INTRAMUSCULAR | Status: AC
  Filled 2014-03-30: qty 1

## 2014-03-30 MED ORDER — LACTATED RINGERS IV SOLN
INTRAVENOUS | Status: DC
  Administered 2014-03-30 (×2): via INTRAVENOUS

## 2014-03-30 MED ORDER — OXYMETAZOLINE HCL 0.05 % NA SOLN
NASAL | Status: DC | PRN
  Administered 2014-03-30: 1 via NASAL

## 2014-03-30 MED ORDER — OXYCODONE-ACETAMINOPHEN 5-325 MG PO TABS: 1.0000 | ORAL_TABLET | ORAL | Status: DC | PRN

## 2014-03-30 MED ORDER — FENTANYL CITRATE 0.05 MG/ML IJ SOLN
INTRAMUSCULAR | Status: AC
  Filled 2014-03-30: qty 6

## 2014-03-30 MED ORDER — MIDAZOLAM HCL 2 MG/2ML IJ SOLN
INTRAMUSCULAR | Status: AC
  Filled 2014-03-30: qty 2

## 2014-03-30 MED ORDER — MIDAZOLAM HCL 5 MG/5ML IJ SOLN
INTRAMUSCULAR | Status: DC | PRN
  Administered 2014-03-30: 2 mg via INTRAVENOUS

## 2014-03-30 MED ORDER — LIDOCAINE-EPINEPHRINE 1 %-1:100000 IJ SOLN
INTRAMUSCULAR | Status: DC | PRN
  Administered 2014-03-30: 3 mL

## 2014-03-30 MED ORDER — LIDOCAINE-EPINEPHRINE 1 %-1:100000 IJ SOLN
INTRAMUSCULAR | Status: AC
  Filled 2014-03-30: qty 1

## 2014-03-30 MED ORDER — ONDANSETRON HCL 4 MG/2ML IJ SOLN
INTRAMUSCULAR | Status: DC | PRN
  Administered 2014-03-30: 4 mg via INTRAVENOUS

## 2014-03-30 MED ORDER — FENTANYL CITRATE 0.05 MG/ML IJ SOLN: 50.0000 ug | INTRAMUSCULAR | Status: DC | PRN

## 2014-03-30 MED ORDER — MIDAZOLAM HCL 2 MG/2ML IJ SOLN: 1.0000 mg | INTRAMUSCULAR | Status: DC | PRN

## 2014-03-30 MED ORDER — MUPIROCIN 2 % EX OINT
TOPICAL_OINTMENT | CUTANEOUS | Status: AC
  Filled 2014-03-30: qty 22

## 2014-03-30 MED ORDER — OXYCODONE HCL 5 MG/5ML PO SOLN
5.0000 mg | Freq: Once | ORAL | Status: DC | PRN
Start: 2014-03-30 — End: 2014-03-30

## 2014-03-30 MED ORDER — SUCCINYLCHOLINE CHLORIDE 20 MG/ML IJ SOLN
INTRAMUSCULAR | Status: DC | PRN
  Administered 2014-03-30: 100 mg via INTRAVENOUS

## 2014-03-30 MED ORDER — CEFAZOLIN SODIUM-DEXTROSE 2-3 GM-% IV SOLR
INTRAVENOUS | Status: DC | PRN
  Administered 2014-03-30: 2 g via INTRAVENOUS

## 2014-03-30 MED ORDER — LIDOCAINE HCL (CARDIAC) 20 MG/ML IV SOLN
INTRAVENOUS | Status: DC | PRN
  Administered 2014-03-30: 50 mg via INTRAVENOUS

## 2014-03-30 MED ORDER — CEFAZOLIN SODIUM-DEXTROSE 2-3 GM-% IV SOLR
INTRAVENOUS | Status: AC
  Filled 2014-03-30: qty 50

## 2014-03-30 SURGICAL SUPPLY — 35 items
ATTRACTOMAT 16X20 MAGNETIC DRP (DRAPES) IMPLANT
CANISTER SUCT 1200ML W/VALVE (MISCELLANEOUS) ×2 IMPLANT
COAGULATOR SUCT 8FR VV (MISCELLANEOUS) ×2 IMPLANT
DECANTER SPIKE VIAL GLASS SM (MISCELLANEOUS) IMPLANT
DRSG NASOPORE 8CM (GAUZE/BANDAGES/DRESSINGS) IMPLANT
DRSG TELFA 3X8 NADH (GAUZE/BANDAGES/DRESSINGS) IMPLANT
ELECT REM PT RETURN 9FT ADLT (ELECTROSURGICAL) ×2
ELECTRODE REM PT RTRN 9FT ADLT (ELECTROSURGICAL) ×1 IMPLANT
GLOVE BIO SURGEON STRL SZ7 (GLOVE) ×1 IMPLANT
GLOVE BIO SURGEON STRL SZ7.5 (GLOVE) ×2 IMPLANT
GLOVE BIOGEL PI IND STRL 7.5 (GLOVE) IMPLANT
GLOVE BIOGEL PI INDICATOR 7.5 (GLOVE) ×2
GLOVE SURG SS PI 7.5 STRL IVOR (GLOVE) ×1 IMPLANT
GOWN STRL REUS W/ TWL LRG LVL3 (GOWN DISPOSABLE) ×2 IMPLANT
GOWN STRL REUS W/TWL LRG LVL3 (GOWN DISPOSABLE) ×6
NDL HYPO 25X1 1.5 SAFETY (NEEDLE) ×1 IMPLANT
NEEDLE HYPO 25X1 1.5 SAFETY (NEEDLE) ×2 IMPLANT
NS IRRIG 1000ML POUR BTL (IV SOLUTION) ×2 IMPLANT
PACK BASIN DAY SURGERY FS (CUSTOM PROCEDURE TRAY) ×2 IMPLANT
PACK ENT DAY SURGERY (CUSTOM PROCEDURE TRAY) ×2 IMPLANT
PAD DRESSING TELFA 3X8 NADH (GAUZE/BANDAGES/DRESSINGS) IMPLANT
SLEEVE SCD COMPRESS KNEE MED (MISCELLANEOUS) IMPLANT
SOLUTION BUTLER CLEAR DIP (MISCELLANEOUS) ×2 IMPLANT
SPLINT NASAL DOYLE BI-VL (GAUZE/BANDAGES/DRESSINGS) ×1 IMPLANT
SPONGE GAUZE 2X2 8PLY STRL LF (GAUZE/BANDAGES/DRESSINGS) ×2 IMPLANT
SPONGE NEURO XRAY DETECT 1X3 (DISPOSABLE) ×2 IMPLANT
SUT CHROMIC 4 0 P 3 18 (SUTURE) ×2 IMPLANT
SUT PLAIN 4 0 ~~LOC~~ 1 (SUTURE) ×2 IMPLANT
SUT PROLENE 3 0 PS 2 (SUTURE) ×2 IMPLANT
SUT VIC AB 4-0 P-3 18XBRD (SUTURE) IMPLANT
SUT VIC AB 4-0 P3 18 (SUTURE)
TOWEL OR 17X24 6PK STRL BLUE (TOWEL DISPOSABLE) ×2 IMPLANT
TUBE SALEM SUMP 12R W/ARV (TUBING) IMPLANT
TUBE SALEM SUMP 16 FR W/ARV (TUBING) ×2 IMPLANT
YANKAUER SUCT BULB TIP NO VENT (SUCTIONS) ×2 IMPLANT

## 2014-03-30 NOTE — Transfer of Care (Signed)
Immediate Anesthesia Transfer of Care Note  Patient: Luis Lynch  Procedure(s) Performed: Procedure(s): NASAL SEPTOPLASTY WITH BILATERAL TURBINATE REDUCTION (Bilateral)  Patient Location: PACU  Anesthesia Type:General  Level of Consciousness: awake, alert , oriented and patient cooperative  Airway & Oxygen Therapy: Patient Spontanous Breathing and Patient connected to face mask oxygen  Post-op Assessment: Report given to PACU RN and Post -op Vital signs reviewed and stable  Post vital signs: Reviewed and stable  Complications: No apparent anesthesia complications

## 2014-03-30 NOTE — Op Note (Signed)
DATE OF PROCEDURE: 03/30/2014  OPERATIVE REPORT   SURGEON: Newman Pies, MD   PREOPERATIVE DIAGNOSES:  1. Severe nasal septal deviation.  2. Bilateral inferior turbinate hypertrophy.  3. Chronic nasal obstruction.  POSTOPERATIVE DIAGNOSES:  1. Severe nasal septal deviation.  2. Bilateral inferior turbinate hypertrophy.  3. Chronic nasal obstruction.  PROCEDURE PERFORMED:  1. Septoplasty.  2. Bilateral partial inferior turbinate resection.   ANESTHESIA: General endotracheal tube anesthesia.   COMPLICATIONS: None.   ESTIMATED BLOOD LOSS: Less than xxx mL.   INDICATION FOR PROCEDURE: Luis Lynch is a 23 y.o. male with a history of chronic nasal obstruction. The patient was  treated with antihistamine, decongestant, steroid nasal spray, and systemic steroids. However, the patient continues to be symptomatic. On examination, the patient was noted to have bilateral severe inferior turbinate hypertrophy and significant nasal septal deviation, causing significant nasal obstruction. Based on the above findings, the decision was made for the patient to undergo the above-stated procedure. The risks, benefits, alternatives, and details of the procedure were discussed with the patient. Questions were invited and answered. Informed consent was obtained.   DESCRIPTION OF PROCEDURE: The patient was taken to the operating room and placed supine on the operating table. General endotracheal tube anesthesia was administered by the anesthesiologist. The patient was positioned, and prepped and draped in the standard fashion for nasal surgery. Pledgets soaked with Afrin were placed in both nasal cavities for decongestion. The pledgets were subsequently removed. The above mentioned severe septal deviation was again noted. 1% lidocaine with 1:100,000 epinephrine was injected onto the nasal septum bilaterally. A hemitransfixion incision was made on the left side. The mucosal flap was carefully elevated on the  left side. A cartilaginous incision was made 1 cm superior to the caudal margin of the nasal septum. Mucosal flap was also elevated on the right side in the similar fashion. It should be noted that due to the severe septal deviation, the deviated portion of the cartilaginous and bony septum had to be removed in piecemeal fashion. Once the deviated portions were removed, a straight midline septum was achieved. The septum was then quilted with 4-0 plain gut sutures. The hemitransfixion incision was closed with interrupted 4-0 chromic sutures. Doyle splints were applied.   Prior to the [facility] splint application, the inferior one half of both hypertrophied inferior turbinate was crossclamped with a Kelly clamp. The inferior one half of each inferior turbinate was then resected with a pair of cross cutting scissors. Hemostasis was achieved with a suction cautery device.   The care of the patient was turned over to the anesthesiologist. The patient was awakened from anesthesia without difficulty. The patient was extubated and transferred to the recovery room in good condition.   OPERATIVE FINDINGS: Severe nasal septal deviation and bilateral inferior turbinate hypertrophy.   SPECIMEN: None.   FOLLOWUP CARE: The patient be discharged home once he is awake and alert. The patient will be placed on Percocet 1-2 tablets p.o. q.6 hours p.r.n. pain, and amoxicillin 875 mg p.o. b.i.d. for 5 days. The patient will follow up in my office in approximately 1 week for splint removal.   Kenijah Benningfield Philomena Doheny, MD

## 2014-03-30 NOTE — Anesthesia Preprocedure Evaluation (Signed)
Anesthesia Evaluation  Patient identified by MRN, date of birth, ID band Patient awake    Reviewed: Allergy & Precautions, H&P , NPO status , Patient's Chart, lab work & pertinent test results  History of Anesthesia Complications (+) PONV  Airway Mallampati: III TM Distance: >3 FB Neck ROM: Full    Dental no notable dental hx. (+) Teeth Intact, Dental Advisory Given   Pulmonary neg pulmonary ROS, former smoker,  breath sounds clear to auscultation  Pulmonary exam normal       Cardiovascular negative cardio ROS  Rhythm:Regular Rate:Normal     Neuro/Psych Anxiety negative neurological ROS     GI/Hepatic negative GI ROS, Neg liver ROS,   Endo/Other  negative endocrine ROS  Renal/GU negative Renal ROS  negative genitourinary   Musculoskeletal   Abdominal   Peds  Hematology negative hematology ROS (+)   Anesthesia Other Findings   Reproductive/Obstetrics negative OB ROS                           Anesthesia Physical Anesthesia Plan  ASA: II  Anesthesia Plan: General   Post-op Pain Management:    Induction: Intravenous  Airway Management Planned: Oral ETT  Additional Equipment:   Intra-op Plan:   Post-operative Plan: Extubation in OR  Informed Consent: I have reviewed the patients History and Physical, chart, labs and discussed the procedure including the risks, benefits and alternatives for the proposed anesthesia with the patient or authorized representative who has indicated his/her understanding and acceptance.   Dental advisory given  Plan Discussed with: CRNA  Anesthesia Plan Comments:         Anesthesia Quick Evaluation

## 2014-03-30 NOTE — Anesthesia Postprocedure Evaluation (Signed)
  Anesthesia Post-op Note  Patient: Luis Lynch  Procedure(s) Performed: Procedure(s): NASAL SEPTOPLASTY WITH BILATERAL TURBINATE REDUCTION (Bilateral)  Patient Location: PACU  Anesthesia Type:General  Level of Consciousness: awake and alert   Airway and Oxygen Therapy: Patient Spontanous Breathing  Post-op Pain: mild  Post-op Assessment: Post-op Vital signs reviewed, Patient's Cardiovascular Status Stable and Respiratory Function Stable  Post-op Vital Signs: Reviewed  Filed Vitals:   03/30/14 1130  BP: 161/95  Pulse: 78  Temp:   Resp: 18    Complications: No apparent anesthesia complications

## 2014-03-30 NOTE — Anesthesia Procedure Notes (Signed)
Procedure Name: Intubation Date/Time: 03/30/2014 8:40 AM Performed by: Caren Macadam Pre-anesthesia Checklist: Patient identified, Emergency Drugs available, Suction available and Patient being monitored Patient Re-evaluated:Patient Re-evaluated prior to inductionOxygen Delivery Method: Circle System Utilized Preoxygenation: Pre-oxygenation with 100% oxygen Intubation Type: IV induction Ventilation: Mask ventilation without difficulty Laryngoscope Size: Miller and 2 Grade View: Grade I Tube type: Oral Tube size: 7.0 mm Number of attempts: 1 Airway Equipment and Method: stylet and oral airway Placement Confirmation: ETT inserted through vocal cords under direct vision,  positive ETCO2 and breath sounds checked- equal and bilateral Secured at: 22 cm Tube secured with: Tape Dental Injury: Teeth and Oropharynx as per pre-operative assessment

## 2014-03-30 NOTE — H&P (Signed)
  H&P Update  Pt's original H&P dated 03/22/14 reviewed and placed in chart (to be scanned).  I personally examined the patient today.  No change in health. Proceed with septoplasty and bilateral turbinate reduction.

## 2014-03-30 NOTE — Discharge Instructions (Addendum)

## 2014-04-01 ENCOUNTER — Encounter (HOSPITAL_BASED_OUTPATIENT_CLINIC_OR_DEPARTMENT_OTHER): Payer: Self-pay | Admitting: Otolaryngology

## 2014-12-14 ENCOUNTER — Encounter: Payer: Self-pay | Admitting: *Deleted

## 2014-12-14 ENCOUNTER — Emergency Department (INDEPENDENT_AMBULATORY_CARE_PROVIDER_SITE_OTHER): Payer: BLUE CROSS/BLUE SHIELD

## 2014-12-14 ENCOUNTER — Emergency Department (INDEPENDENT_AMBULATORY_CARE_PROVIDER_SITE_OTHER)
Admission: EM | Admit: 2014-12-14 | Discharge: 2014-12-14 | Disposition: A | Discharge status: Home / Self Care | Payer: BLUE CROSS/BLUE SHIELD | Source: Home / Self Care | Attending: Family Medicine | Admitting: Family Medicine

## 2014-12-14 DIAGNOSIS — M419 Scoliosis, unspecified: Secondary | ICD-10-CM

## 2014-12-14 DIAGNOSIS — M545 Low back pain, unspecified: Secondary | ICD-10-CM

## 2014-12-14 DIAGNOSIS — L309 Dermatitis, unspecified: Secondary | ICD-10-CM

## 2014-12-14 DIAGNOSIS — M4120 Other idiopathic scoliosis, site unspecified: Secondary | ICD-10-CM | POA: Diagnosis present

## 2014-12-14 MED ORDER — CLOBETASOL PROPIONATE 0.05 % EX OINT: 1.0000 "application " | TOPICAL_OINTMENT | Freq: Two times a day (BID) | CUTANEOUS | Status: DC

## 2014-12-14 NOTE — ED Provider Notes (Signed)
Luis Lynch is a 24 y.o. male who presents to Urgent Care today for left buttock pain radiating to the left leg. Symptoms present for 2 months. No weakness, numbness bowel bladder problems or difficulty walking.  Symptoms are somewhat bothersome with work. 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 Additionally he notes his pain is somewhat worsened by his MMA activities that he does. Symptoms are moderate and intermittent. He's tried some over-the-counter medications that help a bit. No injury that he knows of.  Additionally patient has a rash on his hands bilaterally. This is been present during the entire winter. He thinks it's due to washing his hands a lot at work as he comes in contact with industrial glue. He has not tried any treatment yet.   Past Medical History  Diagnosis Date  . Anxiety   . PONV (postoperative nausea and vomiting)   . Nasal turbinate hypertrophy    Past Surgical History  Procedure Laterality Date  . Mandible surgery  2004  . Nose surgery    . Leg surgery      left  . Wisdom tooth extraction    . Nasal septoplasty w/ turbinoplasty Bilateral 03/30/2014    Procedure: NASAL SEPTOPLASTY WITH BILATERAL TURBINATE REDUCTION;  Surgeon: Darletta MollSui W Teoh, MD;  Location: [facility];  Service: ENT;  Laterality: Bilateral;   History  Substance Use Topics  . Smoking status: Current Every Day Smoker -- 1.00 packs/day for 2 years    Last Attempt to Quit: 03/24/2013  . Smokeless tobacco: Never Used  . Alcohol Use: 0.0 oz/week    0 Not specified per week     Comment: Drinks about one drink per couple months   ROS as above Medications: No current facility-administered medications for this encounter.   Current Outpatient Prescriptions  Medication Sig Dispense Refill  . albuterol (PROVENTIL HFA;VENTOLIN HFA) 108 (90 BASE) MCG/ACT inhaler Inhale 2 puffs into the lungs every 4 (four) hours as needed for wheezing. 18 g 0  . clobetasol  ointment (TEMOVATE) 0.05 % Apply 1 application topically 2 (two) times daily. 30 g 1  . [DISCONTINUED] venlafaxine XR (EFFEXOR XR) 37.5 MG 24 hr capsule Take 1 capsule (37.5 mg total) by mouth daily with breakfast. 30 capsule 1   Allergies  Allergen Reactions  . Flexeril [Cyclobenzaprine]   . Pollen Extract      Exam:  BP 131/81 mmHg  Pulse 65  Temp(Src) 98.1 F (36.7 C) (Oral)  Resp 18  Ht 6' (1.829 m)  Wt 171 lb (77.565 kg)  BMI 23.19 kg/m2  SpO2 99% Gen: Well NAD HEENT: EOMI,  MMM Lungs: Normal work of breathing. CTABL Heart: RRR no MRG Abd: NABS, Soft. Nondistended, Nontender Exts: Brisk capillary refill, warm and well perfused.  Back: Nontender to spinal midline. Tender palpation left SI joint. Negative straight leg raise test and Faber test bilaterally. Range of motion of low back is slightly limited in flexion by pain normal otherwise. Reflexes are equal and normal bilateral knees and ankles. Strength is intact throughout bilateral lower extremities. Sensation intact throughout. Normal gait. Skin: Hands: Erythematous on the dorsal aspect of the hands with cracked skin at the dorsal MCPs. Normal hand motion and sensation capillary refill bilaterally.  No results found for this or any previous visit (from the past 24 hour(s)). Dg Lumbar Spine Complete  12/14/2014   CLINICAL DATA:  Low back pain radiating into left buttocks and hip regions for 2 months  EXAM:  LUMBAR SPINE - COMPLETE 4+ VIEW  COMPARISON:  October 10, 2010  FINDINGS: Frontal, lateral, spot lumbosacral lateral, and bilateral oblique views were obtained. There are 5 non-rib-bearing lumbar type vertebral bodies. There is levoscoliosis with a rotatory component. There is no fracture or spondylolisthesis. Disc spaces appear intact. There is appreciable facet arthropathy.  IMPRESSION: Scoliosis. No fracture or spondylolisthesis. No appreciable arthropathic change.   Electronically Signed   By: Bretta Bang III  M.D.   On: 12/14/2014 17:04    Assessment and Plan: 24 y.o. male with  1) lumbago. This is in the setting of mild scoliosis. Plan to work on home exercises and core strengthening. Treat with over-the-counter NSAIDs as needed. 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 Recommend use of gloves. Will treat with clobetasol ointment. Follow-up with PCP.  Discussed warning signs or symptoms. Please see discharge instructions. Patient expresses understanding.     Rodolph Bong, MD 12/14/14 [phone]

## 2014-12-14 NOTE — Discharge Instructions (Signed)
Thank you for coming in today. Come back or go to the emergency room if you notice new weakness new numbness problems walking or bowel or bladder problems.   Back Exercises Back exercises help treat and prevent back injuries. The goal of back exercises is to increase the strength of your abdominal and back muscles and the flexibility of your back. These exercises should be started when you no longer have back pain. Back exercises include:  Pelvic Tilt. Lie on your back with your knees bent. Tilt your pelvis until the lower part of your back is against the floor. Hold this position 5 to 10 sec and repeat 5 to 10 times.  Knee to Chest. Pull first 1 knee up against your chest and hold for 20 to 30 seconds, repeat this with the other knee, and then both knees. This may be done with the other leg straight or bent, whichever feels better.  Sit-Ups or Curl-Ups. Bend your knees 90 degrees. Start with tilting your pelvis, and do a partial, slow sit-up, lifting your trunk only 30 to 45 degrees off the floor. Take at least 2 to 3 seconds for each sit-up. Do not do sit-ups with your knees out straight. If partial sit-ups are difficult, simply do the above but with only tightening your abdominal muscles and holding it as directed.  Hip-Lift. Lie on your back with your knees flexed 90 degrees. Push down with your feet and shoulders as you raise your hips a couple inches off the floor; hold for 10 seconds, repeat 5 to 10 times.  Back arches. Lie on your stomach, propping yourself up on bent elbows. Slowly press on your hands, causing an arch in your low back. Repeat 3 to 5 times. Any initial stiffness and discomfort should lessen with repetition over time.  Shoulder-Lifts. Lie face down with arms beside your body. Keep hips and torso pressed to floor as you slowly lift your head and shoulders off the floor. Do not overdo your exercises, especially in the beginning. Exercises may cause you some mild back  discomfort which lasts for a few minutes; however, if the pain is more severe, or lasts for more than 15 minutes, do not continue exercises until you see your caregiver. Improvement with exercise therapy for back problems is slow.  See your caregivers for assistance with developing a proper back exercise program. Document Released: 11/29/2004 Document Revised: 01/14/2012 Document Reviewed: 08/23/2011 [facility] Patient Information 2015 [place], [place]. This information is not intended to replace advice given to you by your health care provider. Make sure you discuss any questions you have with your health care provider.  Contact Dermatitis Contact dermatitis is a reaction to certain substances that touch the skin. Contact dermatitis can be either irritant contact dermatitis or allergic contact dermatitis. Irritant contact dermatitis does not require previous exposure to the substance for a reaction to occur.Allergic contact dermatitis only occurs if you have been exposed to the substance before. Upon a repeat exposure, your body reacts to the substance.  CAUSES  Many substances can cause contact dermatitis. Irritant dermatitis is most commonly caused by repeated exposure to mildly irritating substances, such as:  Makeup.  Soaps.  Detergents.  Bleaches.  Acids.  Metal salts, such as nickel. Allergic contact dermatitis is most commonly caused by exposure to:  Poisonous plants.  Chemicals (deodorants, shampoos).  Jewelry.  Latex.  Neomycin in triple antibiotic cream.  Preservatives in products, including clothing. SYMPTOMS  The area of skin that is exposed may develop:  Dryness or flaking.  Redness.  Cracks.  Itching.  Pain or a burning sensation.  Blisters. With allergic contact dermatitis, there may also be swelling in areas such as the eyelids, mouth, or genitals.  DIAGNOSIS  Your caregiver can usually tell what the problem is by doing a physical exam. In cases where  the cause is uncertain and an allergic contact dermatitis is suspected, a patch skin test may be performed to help determine the cause of your dermatitis. TREATMENT Treatment includes protecting the skin from further contact with the irritating substance by avoiding that substance if possible. Barrier creams, powders, and gloves may be helpful. Your caregiver may also recommend:  Steroid creams or ointments applied 2 times daily. For best results, soak the rash area in cool water for 20 minutes. Then apply the medicine. Cover the area with a plastic wrap. You can store the steroid cream in the refrigerator for a "chilly" effect on your rash. That may decrease itching. Oral steroid medicines may be needed in more severe cases.  Antibiotics or antibacterial ointments if a skin infection is present.  Antihistamine lotion or an antihistamine taken by mouth to ease itching.  Lubricants to keep moisture in your skin.  Burow's solution to reduce redness and soreness or to dry a weeping rash. Mix one packet or tablet of solution in 2 cups cool water. Dip a clean washcloth in the mixture, wring it out a bit, and put it on the affected area. Leave the cloth in place for 30 minutes. Do this as often as possible throughout the day.  Taking several cornstarch or baking soda baths daily if the area is too large to cover with a washcloth. Harsh chemicals, such as alkalis or acids, can cause skin damage that is like a burn. You should flush your skin for 15 to 20 minutes with cold water after such an exposure. You should also seek immediate medical care after exposure. Bandages (dressings), antibiotics, and pain medicine may be needed for severely irritated skin.  HOME CARE INSTRUCTIONS  Avoid the substance that caused your reaction.  Keep the area of skin that is affected away from hot water, soap, sunlight, chemicals, acidic substances, or anything else that would irritate your skin.  Do not scratch the  rash. Scratching may cause the rash to become infected.  You may take cool baths to help stop the itching.  Only take over-the-counter or prescription medicines as directed by your caregiver.  See your caregiver for follow-up care as directed to make sure your skin is healing properly. SEEK MEDICAL CARE IF:   Your condition is not better after 3 days of treatment.  You seem to be getting worse.  You see signs of infection such as swelling, tenderness, redness, soreness, or warmth in the affected area.  You have any problems related to your medicines. Document Released: 10/19/2000 Document Revised: 01/14/2012 Document Reviewed: 03/27/2011 [facility] Patient Information 2015 [place], [place]. This information is not intended to replace advice given to you by your health care provider. Make sure you discuss any questions you have with your health care provider.
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
## 2014-12-27 ENCOUNTER — Encounter: Payer: Self-pay | Admitting: Sports Medicine

## 2014-12-27 ENCOUNTER — Ambulatory Visit (INDEPENDENT_AMBULATORY_CARE_PROVIDER_SITE_OTHER): Payer: BLUE CROSS/BLUE SHIELD | Admitting: Sports Medicine

## 2014-12-27 VITALS — BP 123/74 | HR 63 | Ht 71.0 in | Wt 174.0 lb

## 2014-12-27 DIAGNOSIS — Z202 Contact with and (suspected) exposure to infections with a predominantly sexual mode of transmission: Secondary | ICD-10-CM

## 2014-12-27 DIAGNOSIS — Z0000 Encounter for general adult medical examination without abnormal findings: Secondary | ICD-10-CM | POA: Diagnosis not present

## 2014-12-27 NOTE — Assessment & Plan Note (Signed)
Complete physical as above with routine blood work. He has done a great job finding ways to control his anxiety and depression, this time with kickboxing.

## 2014-12-27 NOTE — Assessment & Plan Note (Signed)
Checking routine screening. Did recently have unprotected intercourse, no protection was used, patient does not have any symptoms.

## 2014-12-27 NOTE — Progress Notes (Signed)
  Subjective:    CC: Complete physical   HPI:  This is a pleasant 24 year old male, he has a history of anxiety and depression that is now under good control, he has been doing a lot more physical activity, in the form of kickboxing. Otherwise he has no complaints, he is amenable to getting routine screening blood work today.  He did recently have sexual intercourse unprotected, and would like an STD screen. He has no symptoms.  Past medical history, Surgical history, Family history not pertinant except as noted below, Social history, Allergies, and medications have been entered into the medical record, reviewed, and no changes needed.   Review of Systems: No headache, visual changes, nausea, vomiting, diarrhea, constipation, dizziness, abdominal pain, skin rash, fevers, chills, night sweats, swollen lymph nodes, weight loss, chest pain, body aches, joint swelling, muscle aches, shortness of breath, mood changes, visual or auditory hallucinations.  Objective:    General: Well Developed, well nourished, and in no acute distress.  Neuro: Alert and oriented x3, extra-ocular muscles intact, sensation grossly intact. Cranial nerves II through XII are intact, motor, sensory, and coordinative functions are all intact. HEENT: Normocephalic, atraumatic, pupils equal round reactive to light, neck supple, no masses, no lymphadenopathy, thyroid nonpalpable. Oropharynx, nasopharynx, external ear canals are unremarkable. Skin: Warm and dry, no rashes noted. There are a couple of abdominal cutaneous skin tags noted. Cardiac: Regular rate and rhythm, no murmurs rubs or gallops.  Respiratory: Clear to auscultation bilaterally. Not using accessory muscles, speaking in full sentences.  Abdominal: Soft, nontender, nondistended, positive bowel sounds, no masses, no organomegaly.  Musculoskeletal: Shoulder, elbow, wrist, hip, knee, ankle stable, and with full range of motion.  Impression and Recommendations:     The patient was counselled, risk factors were discussed, anticipatory guidance given.

## 2014-12-28 LAB — HEMOGLOBIN A1C
Hgb A1c MFr Bld: 5.5 % (ref <5.7)
Mean Plasma Glucose: 111 mg/dL (ref <117)

## 2014-12-28 LAB — COMPREHENSIVE METABOLIC PANEL
ALT: 25 U/L (ref 0–53)
AST: 24 U/L (ref 0–37)
Albumin: 4.5 g/dL (ref 3.5–5.2)
Alkaline Phosphatase: 62 U/L (ref 39–117)
BUN: 14 mg/dL (ref 6–23)
CO2: 27 mEq/L (ref 19–32)
Calcium: 9.7 mg/dL (ref 8.4–10.5)
Chloride: 103 mEq/L (ref 96–112)
Creat: 0.87 mg/dL (ref 0.50–1.35)
Glucose, Bld: 86 mg/dL (ref 70–99)
Potassium: 4.2 mEq/L (ref 3.5–5.3)
Sodium: 140 mEq/L (ref 135–145)
Total Bilirubin: 0.7 mg/dL (ref 0.2–1.2)
Total Protein: 7.1 g/dL (ref 6.0–8.3)

## 2014-12-28 LAB — LIPID PANEL
Cholesterol: 132 mg/dL (ref 0–200)
HDL: 41 mg/dL (ref >=40)
LDL Cholesterol: 77 mg/dL (ref 0–99)
Total CHOL/HDL Ratio: 3.2 Ratio
Triglycerides: 69 mg/dL (ref <150)
VLDL: 14 mg/dL (ref 0–40)

## 2014-12-28 LAB — CBC
HCT: 48.1 % (ref 39.0–52.0)
Hemoglobin: 16.3 g/dL (ref 13.0–17.0)
MCH: 30.2 pg (ref 26.0–34.0)
MCHC: 33.9 g/dL (ref 30.0–36.0)
MCV: 89.1 fL (ref 78.0–100.0)
MPV: 12 fL (ref 8.6–12.4)
Platelets: 265 10*3/uL (ref 150–400)
RBC: 5.4 MIL/uL (ref 4.22–5.81)
RDW: 13.6 % (ref 11.5–15.5)
WBC: 7.2 10*3/uL (ref 4.0–10.5)

## 2014-12-28 LAB — TSH: TSH: 1.824 u[IU]/mL (ref 0.350–4.500)

## 2014-12-29 ENCOUNTER — Telehealth: Payer: Self-pay | Admitting: Sports Medicine

## 2014-12-29 LAB — HEPATITIS PANEL, ACUTE
HCV Ab: NEGATIVE
Hep A IgM: NONREACTIVE
Hep B C IgM: NONREACTIVE
Hepatitis B Surface Ag: NEGATIVE

## 2014-12-29 LAB — GC/CHLAMYDIA PROBE AMP, URINE
Chlamydia, Swab/Urine, PCR: NEGATIVE
GC Probe Amp, Urine: NEGATIVE

## 2014-12-29 LAB — RPR

## 2014-12-29 LAB — VITAMIN D 25 HYDROXY (VIT D DEFICIENCY, FRACTURES): Vit D, 25-Hydroxy: 25 ng/mL — ABNORMAL LOW (ref 30–100)

## 2014-12-29 LAB — HIV ANTIBODY (ROUTINE TESTING W REFLEX): HIV 1&2 Ab, 4th Generation: NONREACTIVE

## 2014-12-29 NOTE — Telephone Encounter (Signed)
Dr. Karie Schwalbe could you please result these labs so I can call the patient. Keifer Habib,CMA

## 2014-12-29 NOTE — Telephone Encounter (Signed)
Patient has called three times today 12/29/14 wanting to get information about his lab results. Patient said he has left messages and know one is returning his phone call. I adv pt that the nurses will call back if a message is left but I would send a phone note. Can someone please call this guy back.

## 2014-12-30 LAB — HSV(HERPES SMPLX)ABS-I+II(IGG+IGM)-BLD
HSV 1 Glycoprotein G Ab, IgG: 11.17 IV — ABNORMAL HIGH
HSV 2 Glycoprotein G Ab, IgG: <0.1 IV
Herpes Simplex Vrs I&II-IgM Ab (EIA): 1.2 INDEX — ABNORMAL HIGH

## 2014-12-30 MED ORDER — VITAMIN D (ERGOCALCIFEROL) 1.25 MG (50000 UNIT) PO CAPS: 50000.0000 [IU] | ORAL_CAPSULE | ORAL | Status: DC

## 2014-12-30 NOTE — Telephone Encounter (Signed)
I haven't resulted because all of the numbers are not back yet, all of his STD panels are negative, we are still waiting on herpes titers. Vitamin D is a bit low. I will call in a supplement.

## 2014-12-30 NOTE — Telephone Encounter (Signed)
Patient has been informed. Wendell Nicoson,CMA  

## 2015-01-15 ENCOUNTER — Emergency Department (INDEPENDENT_AMBULATORY_CARE_PROVIDER_SITE_OTHER)
Admission: EM | Admit: 2015-01-15 | Discharge: 2015-01-15 | Disposition: A | Discharge status: Home / Self Care | Payer: BLUE CROSS/BLUE SHIELD | Source: Home / Self Care | Attending: Family Medicine | Admitting: Family Medicine

## 2015-01-15 DIAGNOSIS — G44229 Chronic tension-type headache, not intractable: Secondary | ICD-10-CM | POA: Diagnosis not present

## 2015-01-15 MED ORDER — ISOMETHEPTENE-APAP-DICHLORAL 65-325-100 MG PO CAPS: ORAL_CAPSULE | ORAL | Status: DC

## 2015-01-15 NOTE — ED Notes (Signed)
Patient states he has had headaches in the past that are becoming more frequent. Today patient states his headache is only a 2 out of 10, and is located in eye area, below eyebrows.

## 2015-01-15 NOTE — Discharge Instructions (Signed)
Minimize Excedrin Migraine tabs and caffeine.   Tension Headache A tension headache is a feeling of pain, pressure, or aching often felt over the front and sides of the head. The pain can be dull or can feel tight (constricting). It is the most common type of headache. Tension headaches are not normally associated with nausea or vomiting and do not get worse with physical activity. Tension headaches can last 30 minutes to several days.  CAUSES  The exact cause is not known, but it may be caused by chemicals and hormones in the brain that lead to pain. Tension headaches often begin after stress, anxiety, or depression. Other triggers may include:  Alcohol.  Caffeine (too much or withdrawal).  Respiratory infections (colds, flu, sinus infections).  Dental problems or teeth clenching.  Fatigue.  Holding your head and neck in one position too long while using a computer. SYMPTOMS   Pressure around the head.   Dull, aching head pain.   Pain felt over the front and sides of the head.   Tenderness in the muscles of the head, neck, and shoulders. DIAGNOSIS  A tension headache is often diagnosed based on:   Symptoms.   Physical examination.   A CT scan or MRI of your head. These tests may be ordered if symptoms are severe or unusual. TREATMENT  Medicines may be given to help relieve symptoms.  HOME CARE INSTRUCTIONS   Only take over-the-counter or prescription medicines for pain or discomfort as directed by your caregiver.   Lie down in a dark, quiet room when you have a headache.   Keep a journal to find out what may be triggering your headaches. For example, write down:  What you eat and drink.  How much sleep you get.  Any change to your diet or medicines.  Try massage or other relaxation techniques.   Ice packs or heat applied to the head and neck can be used. Use these 3 to 4 times per day for 15 to 20 minutes each time, or as needed.   Limit stress.    Sit up straight, and do not tense your muscles.   Quit smoking if you smoke.  Limit alcohol use.  Decrease the amount of caffeine you drink, or stop drinking caffeine.  Eat and exercise regularly.  Get 7 to 9 hours of sleep, or as recommended by your caregiver.  Avoid excessive use of pain medicine as recurrent headaches can occur.  SEEK MEDICAL CARE IF:   You have problems with the medicines you were prescribed.  Your medicines do not work.  You have a change from the usual headache.  You have nausea or vomiting. SEEK IMMEDIATE MEDICAL CARE IF:   Your headache becomes severe.  You have a fever.  You have a stiff neck.  You have loss of vision.  You have muscular weakness or loss of muscle control.  You lose your balance or have trouble walking.  You feel faint or pass out.  You have severe symptoms that are different from your first symptoms. MAKE SURE YOU:   Understand these instructions.  Will watch your condition.  Will get help right away if you are not doing well or get worse. Document Released: 10/22/2005 Document Revised: 01/14/2012 Document Reviewed: 10/12/2011 [facility] Patient Information 2015 [place], [place]. This information is not intended to replace advice given to you by your health care provider. Make sure you discuss any questions you have with your health care provider.

## 2015-01-15 NOTE — ED Provider Notes (Signed)
CSN: [id number]     Arrival date & time 01/15/15  1245 History   First MD Initiated Contact with Patient 01/15/15 1406     Chief Complaint  Patient presents with  . Headache      HPI Comments: Patient complains of a five year history of recurring frontal bi-temporal headaches.  In the past his headaches occurred about every two months and would resolve with Excedrin Migraine or ibuprofen.  Recently his headaches have begun to occur more frequently, about 1 or 2 per week, and usually resolve after taking Advil and a nap.  He often has light sensitivity and nausea without vomiting.  No other neuro symptoms.  No aura.  He describes his headache as a tight band around his head.  His headaches do not awaken him and are not present upon awakening.  He reports increased stress recently.  Patient is a 24 y.o. male presenting with headaches. The history is provided by the patient.  Headache Pain location:  Frontal, L temporal and R temporal Quality:  Dull Radiates to:  Does not radiate Severity currently:  2/10 Severity at highest:  8/10 Onset quality:  Sudden Timing:  Sporadic Progression:  Worsening Chronicity:  Chronic Similar to prior headaches: yes   Context: bright light and emotional stress   Relieved by:  NSAIDs Worsened by:  Light Associated symptoms: photophobia   Associated symptoms: no abdominal pain, no blurred vision, no congestion, no dizziness, no ear pain, no eye pain, no facial pain, no fatigue, no fever, no focal weakness, no hearing loss, no loss of balance, no nausea, no neck pain, no neck stiffness, no numbness, no paresthesias, no sinus pressure, no sore throat, no visual change and no weakness     Past Medical History  Diagnosis Date  . Anxiety   . PONV (postoperative nausea and vomiting)   . Nasal turbinate hypertrophy    Past Surgical History  Procedure Laterality Date  . Mandible surgery  2004  . Nose surgery    . Leg surgery      left  . Wisdom tooth  extraction    . Nasal septoplasty w/ turbinoplasty Bilateral 03/30/2014    Procedure: NASAL SEPTOPLASTY WITH BILATERAL TURBINATE REDUCTION;  Surgeon: Darletta Moll, MD;  Location: [facility];  Service: ENT;  Laterality: Bilateral;   Family History  Problem Relation Age of Onset  . Alcohol abuse Mother   . Hypertension Mother   . Heart disease Maternal Grandmother   . Cancer Maternal Grandmother     lung  . Cancer Maternal Grandfather     colon  . Hyperlipidemia Paternal Grandmother   . Hypertension Paternal Grandmother   . Diabetes Paternal Grandmother   . Cancer Paternal Grandfather     bladder  . Asthma Brother    History  Substance Use Topics  . Smoking status: Current Every Day Smoker -- 1.00 packs/day for 2 years    Last Attempt to Quit: 03/24/2013  . Smokeless tobacco: Never Used  . Alcohol Use: 0.0 oz/week    0 Standard drinks or equivalent per week     Comment: Drinks about one drink per couple months    Review of Systems  Constitutional: Negative for fever and fatigue.  HENT: Negative for congestion, ear pain, hearing loss, sinus pressure and sore throat.   Eyes: Positive for photophobia. Negative for blurred vision and pain.  Gastrointestinal: Negative for nausea and abdominal pain.  Musculoskeletal: Negative for neck pain and neck stiffness.  Neurological:  Positive for headaches. Negative for dizziness, focal weakness, weakness, numbness, paresthesias and loss of balance.  All other systems reviewed and are negative.   Allergies  Flexeril and Pollen extract  Home Medications   Prior to Admission medications   Medication Sig Start Date End Date Taking? Authorizing Provider  isometheptene-acetaminophen-dichloralphenazone (MIDRIN) 65-325-100 MG capsule Take one capsule PO q4hr prn tension headache.  Maximum 8 capsules in 24 hours 01/15/15   Lattie HawStephen A Beese, MD  Vitamin D, Ergocalciferol, (DRISDOL) 50000 UNITS CAPS capsule Take 1 capsule (50,000 Units  total) by mouth every 7 (seven) days. 12/30/14   Monica Bectonhomas J Thekkekandam, MD   BP 124/74 mmHg  Pulse 60  Temp(Src) 97.5 F (36.4 C) (Oral)  Wt 175 lb (79.379 kg)  SpO2 98% Physical Exam Nursing notes and Vital Signs reviewed. Appearance:  Patient appears stated age, and in no acute distress Eyes:  Pupils are equal, round, and reactive to light and accomodation.  Extraocular movement is intact.  Conjunctivae are not inflamed.  Fundi normal.  No photophobia  Ears:  Canals normal.  Tympanic membranes normal.  Nose: Normal turbinates.  No sinus tenderness.   Pharynx:  Normal Neck:  Supple.  No adenopathy Lungs:  Clear to auscultation.  Breath sounds are equal.  Heart:  Regular rate and rhythm without murmurs, rubs, or gallops.  Abdomen:  Nontender without masses or hepatosplenomegaly.  Bowel sounds are present.  No CVA or flank tenderness.  Extremities:  Normal Skin:  No rash present.  Neurologic:  Cranial nerves 2 through 12 are normal.  Patellar, achilles, and elbow reflexes are normal.  Cerebellar function is intact (finger-to-nose and rapid alternating hand movement).  Gait and station are normal.  Grip strength symmetric bilaterally.    ED Course  Procedures  none     MDM   1. Chronic tension-type headache, not intractable    Trial of Midrin Minimize Excedrin Migraine tabs and caffeine. Followup with Family Doctor    Lattie HawStephen A Beese, MD 01/16/15 1025

## 2015-02-17 ENCOUNTER — Emergency Department
Admission: EM | Admit: 2015-02-17 | Discharge: 2015-02-17 | Disposition: A | Discharge status: Home / Self Care | Payer: BLUE CROSS/BLUE SHIELD | Source: Home / Self Care | Attending: Emergency Medicine | Admitting: Emergency Medicine

## 2015-02-17 ENCOUNTER — Encounter: Payer: Self-pay | Admitting: Emergency Medicine

## 2015-02-17 DIAGNOSIS — G43009 Migraine without aura, not intractable, without status migrainosus: Secondary | ICD-10-CM

## 2015-02-17 MED ORDER — SUMATRIPTAN SUCCINATE 6 MG/0.5ML ~~LOC~~ SOLN: 6.0000 mg | Freq: Once | SUBCUTANEOUS | Status: DC

## 2015-02-17 MED ORDER — SUMATRIPTAN SUCCINATE 100 MG PO TABS: 100.0000 mg | ORAL_TABLET | ORAL | Status: DC | PRN

## 2015-02-17 NOTE — ED Notes (Signed)
Headache started today pressure around eyes, forehead, top of head, nausea, fatigue, dizzy.

## 2015-02-17 NOTE — ED Provider Notes (Signed)
CSN: [id number]     Arrival date & time 02/17/15  1326 History   First MD Initiated Contact with Patient 02/17/15 1409     Chief Complaint  Patient presents with  . Headache   (Consider location/radiation/quality/duration/timing/severity/associated sxs/prior Treatment) Patient is a 24 y.o. male presenting with headaches. The history is provided by the patient. No language interpreter was used.  Headache Pain location:  Frontal Quality:  Sharp Radiates to:  Does not radiate Severity currently:  8/10 Severity at highest:  8/10 Onset quality:  Gradual Timing:  Constant Progression:  Worsening Chronicity:  Recurrent Similar to prior headaches: yes   Context: activity and bright light   Relieved by:  Nothing Worsened by:  Nothing Ineffective treatments:  None tried Associated symptoms: eye pain and fatigue   Risk factors: does not have insomnia     Past Medical History  Diagnosis Date  . Anxiety   . PONV (postoperative nausea and vomiting)   . Nasal turbinate hypertrophy    Past Surgical History  Procedure Laterality Date  . Mandible surgery  2004  . Nose surgery    . Leg surgery      left  . Wisdom tooth extraction    . Nasal septoplasty w/ turbinoplasty Bilateral 03/30/2014    Procedure: NASAL SEPTOPLASTY WITH BILATERAL TURBINATE REDUCTION;  Surgeon: Darletta MollSui W Teoh, MD;  Location: [facility];  Service: ENT;  Laterality: Bilateral;   Family History  Problem Relation Age of Onset  . Alcohol abuse Mother   . Hypertension Mother   . Heart disease Maternal Grandmother   . Cancer Maternal Grandmother     lung  . Cancer Maternal Grandfather     colon  . Hyperlipidemia Paternal Grandmother   . Hypertension Paternal Grandmother   . Diabetes Paternal Grandmother   . Cancer Paternal Grandfather     bladder  . Asthma Brother    History  Substance Use Topics  . Smoking status: Current Every Day Smoker -- 1.00 packs/day for 2 years    Last Attempt to Quit:  03/24/2013  . Smokeless tobacco: Never Used  . Alcohol Use: 0.0 oz/week    0 Standard drinks or equivalent per week     Comment: Drinks about one drink per couple months    Review of Systems  Constitutional: Positive for fatigue.  Eyes: Positive for pain.  Neurological: Positive for headaches.  All other systems reviewed and are negative.   Allergies  Flexeril; Paxil; and Pollen extract  Home Medications   Prior to Admission medications   Medication Sig Start Date End Date Taking? Authorizing Provider  aspirin-acetaminophen-caffeine (EXCEDRIN MIGRAINE) [id number] MG per tablet Take by mouth every 6 (six) hours as needed for headache.   Yes Historical Provider, MD  isometheptene-acetaminophen-dichloralphenazone (MIDRIN) 65-325-100 MG capsule Take one capsule PO q4hr prn tension headache.  Maximum 8 capsules in 24 hours 01/15/15   Lattie HawStephen A Beese, MD  SUMAtriptan (IMITREX) 100 MG tablet Take 1 tablet (100 mg total) by mouth every 2 (two) hours as needed for migraine. May repeat in 2 hours if headache persists or recurs. 02/17/15   Elson AreasLeslie K Jaz Mallick, PA-C  Vitamin D, Ergocalciferol, (DRISDOL) 50000 UNITS CAPS capsule Take 1 capsule (50,000 Units total) by mouth every 7 (seven) days. 12/30/14   Monica Bectonhomas J Thekkekandam, MD   BP 134/77 mmHg  Pulse 59  Temp(Src) 97.6 F (36.4 C) (Oral)  Ht 6' (1.829 m)  Wt 176 lb (79.833 kg)  BMI 23.86 kg/m2  SpO2 98%  Physical Exam  Constitutional: He is oriented to person, place, and time. He appears well-developed and well-nourished.  HENT:  Head: Normocephalic and atraumatic.  Right Ear: External ear normal.  Left Ear: External ear normal.  Mouth/Throat: Oropharynx is clear and moist.  Eyes: Conjunctivae and EOM are normal. Pupils are equal, round, and reactive to light.  Neck: Normal range of motion.  Cardiovascular: Normal rate and normal heart sounds.   Pulmonary/Chest: Effort normal.  Abdominal: He exhibits no distension.  Musculoskeletal:  Normal range of motion.  Neurological: He is alert and oriented to person, place, and time. He displays normal reflexes. No cranial nerve deficit. Coordination normal.  Psychiatric: He has a normal mood and affect.  Nursing note and vitals reviewed.   ED Course  Procedures (including critical care time) Labs Review Labs Reviewed - No data to display  Imaging Review No results found.   MDM Pt reports headache has improved since being here.     1. Nonintractable migraine, unspecified migraine type    Pt declined imitrex.   Pt did not fill rx from here for midrin from last visit. Pt given referral to neurology for evaluation and treatment.  Pt given imitrex Rx to decide  if he wants to take next time he has a headache   Elson Areas, PA-C 02/17/15 1710

## 2015-02-17 NOTE — Discharge Instructions (Signed)
Recurrent Migraine Headache A migraine headache is an intense, throbbing pain on one or both sides of your head. Recurrent migraines keep coming back. A migraine can last for 30 minutes to several hours. CAUSES  The exact cause of a migraine headache is not always known. However, a migraine may be caused when nerves in the brain become irritated and release chemicals that cause inflammation. This causes pain. Certain things may also trigger migraines, such as:   Alcohol.  Smoking.  Stress.  Menstruation.  Aged cheeses.  Foods or drinks that contain nitrates, glutamate, aspartame, or tyramine.  Lack of sleep.  Chocolate.  Caffeine.  Hunger.  Physical exertion.  Fatigue.  Medicines used to treat chest pain (nitroglycerine), birth control pills, estrogen, and some blood pressure medicines. SYMPTOMS   Pain on one or both sides of your head.  Pulsating or throbbing pain.  Severe pain that prevents daily activities.  Pain that is aggravated by any physical activity.  Nausea, vomiting, or both.  Dizziness.  Pain with exposure to bright lights, loud noises, or activity.  General sensitivity to bright lights, loud noises, or smells. Before you get a migraine, you may get warning signs that a migraine is coming (aura). An aura may include:  Seeing flashing lights.  Seeing bright spots, halos, or zigzag lines.  Having tunnel vision or blurred vision.  Having feelings of numbness or tingling.  Having trouble talking.  Having muscle weakness. DIAGNOSIS  A recurrent migraine headache is often diagnosed based on:  Symptoms.  Physical examination.  A CT scan or MRI of your head. These imaging tests cannot diagnose migraines but can help rule out other causes of headaches.  TREATMENT  Medicines may be given for pain and nausea. Medicines can also be given to help prevent recurrent migraines. HOME CARE INSTRUCTIONS  Only take over-the-counter or prescription  medicines for pain or discomfort as directed by your health care provider. The use of long-term narcotics is not recommended.  Lie down in a dark, quiet room when you have a migraine.  Keep a journal to find out what may trigger your migraine headaches. For example, write down:  What you eat and drink.  How much sleep you get.  Any change to your diet or medicines.  Limit alcohol consumption.  Quit smoking if you smoke.  Get 7-9 hours of sleep, or as recommended by your health care provider.  Limit stress.  Keep lights dim if bright lights bother you and make your migraines worse. SEEK MEDICAL CARE IF:   You do not get relief from the medicines given to you.  You have a recurrence of pain.  You have a fever. SEEK IMMEDIATE MEDICAL CARE IF:  Your migraine becomes severe.  You have a stiff neck.  You have loss of vision.  You have muscular weakness or loss of muscle control.  You start losing your balance or have trouble walking.  You feel faint or pass out.  You have severe symptoms that are different from your first symptoms. MAKE SURE YOU:   Understand these instructions.  Will watch your condition.  Will get help right away if you are not doing well or get worse. Document Released: 07/17/2001 Document Revised: 03/08/2014 Document Reviewed: 06/29/2013 Exit[employer] Patient Information 2015 [employer]. This information is not intended to replace advice given to you by your health care provider. Make sure you discuss any questions you have with your health care provider.  

## 2016-02-17 ENCOUNTER — Encounter: Payer: Self-pay | Admitting: *Deleted

## 2016-02-17 ENCOUNTER — Emergency Department (INDEPENDENT_AMBULATORY_CARE_PROVIDER_SITE_OTHER): Payer: Self-pay

## 2016-02-17 ENCOUNTER — Emergency Department
Admission: EM | Admit: 2016-02-17 | Discharge: 2016-02-17 | Disposition: A | Discharge status: Home / Self Care | Payer: Self-pay | Source: Home / Self Care | Attending: Family Medicine | Admitting: Family Medicine

## 2016-02-17 DIAGNOSIS — S8012XA Contusion of left lower leg, initial encounter: Secondary | ICD-10-CM

## 2016-02-17 DIAGNOSIS — M79662 Pain in left lower leg: Secondary | ICD-10-CM
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
## 2016-02-17 NOTE — Discharge Instructions (Signed)
Apply ice pack for 30 minutes every 1 to 2 hours today and tomorrow.  Elevate.  Wear Ace wrap until swelling decreases.  May take Ibuprofen 200mg , 4 tabs every 8 hours with food.

## 2016-09-20 ENCOUNTER — Ambulatory Visit (INDEPENDENT_AMBULATORY_CARE_PROVIDER_SITE_OTHER): Payer: Self-pay | Admitting: Sports Medicine

## 2016-09-20 ENCOUNTER — Encounter: Payer: Self-pay | Admitting: Sports Medicine

## 2016-09-20 DIAGNOSIS — J310 Chronic rhinitis: Secondary | ICD-10-CM

## 2016-09-20 MED ORDER — FLUTICASONE PROPIONATE 50 MCG/ACT NA SUSP: NASAL | 3 refills | Status: DC

## 2016-09-20 MED ORDER — BENZONATATE 200 MG PO CAPS: 200.0000 mg | ORAL_CAPSULE | Freq: Three times a day (TID) | ORAL | 0 refills | Status: DC | PRN

## 2016-09-20 NOTE — Assessment & Plan Note (Signed)
Status post nasal septal repair and turbinate reduction surgery. Currently with a cough, mild sore throat, and postnasal drip phenomenon. Adding Tessalon Perles and Flonase. Return to see me if no better in one week for further evaluation and treatment.

## 2016-09-20 NOTE — Progress Notes (Signed)
  Subjective:    CC: Feeling sick  HPI: This is a pleasant 25 year old male, comes in with a several day history of mild sore throat, runny nose. He is post nasal septal surgery and turbinate reduction. Denies any sinus pain or pressure, only has a mild nonproductive cough and no constitutional symptoms.  Past medical history:  Negative.  See flowsheet/record as well for more information.  Surgical history: Negative.  See flowsheet/record as well for more information.  Family history: Negative.  See flowsheet/record as well for more information.  Social history: Negative.  See flowsheet/record as well for more information.  Allergies, and medications have been entered into the medical record, reviewed, and no changes needed.   Review of Systems: No fevers, chills, night sweats, weight loss, chest pain, or shortness of breath.   Objective:    General: Well Developed, well nourished, and in no acute distress.  Neuro: Alert and oriented x3, extra-ocular muscles intact, sensation grossly intact.  HEENT: Normocephalic, atraumatic, pupils equal round reactive to light, neck supple, no masses, no lymphadenopathy, thyroid nonpalpable. Oropharynx, ear canals unremarkable, nasopharynx shows significant mucus, turbinates that are visible are not that boggy. No sinus tenderness to palpation or percussion. Skin: Warm and dry, no rashes. Cardiac: Regular rate and rhythm, no murmurs rubs or gallops, no lower extremity edema.  Respiratory: Clear to auscultation bilaterally. Not using accessory muscles, speaking in full sentences.  Impression and Recommendations:    Chronic rhinitis Status post nasal septal repair and turbinate reduction surgery. Currently with a cough, mild sore throat, and postnasal drip phenomenon. Adding Tessalon Perles and Flonase. Return to see me if no better in one week for further evaluation and treatment.

## 2017-03-20 ENCOUNTER — Emergency Department
Admission: EM | Admit: 2017-03-20 | Discharge: 2017-03-20 | Disposition: A | Discharge status: Home / Self Care | Payer: BLUE CROSS/BLUE SHIELD | Source: Home / Self Care | Attending: Family Medicine | Admitting: Family Medicine

## 2017-03-20 ENCOUNTER — Encounter: Payer: Self-pay | Admitting: *Deleted

## 2017-03-20 DIAGNOSIS — R5383 Other fatigue: Secondary | ICD-10-CM

## 2017-03-20 DIAGNOSIS — M26622 Arthralgia of left temporomandibular joint: Secondary | ICD-10-CM | POA: Diagnosis not present

## 2017-03-20 LAB — POCT CBC W AUTO DIFF (K'VILLE URGENT CARE)

## 2017-03-20 MED ORDER — METAXALONE 800 MG PO TABS: ORAL_TABLET | ORAL | 0 refills | Status: DC

## 2017-03-20 MED ORDER — MELOXICAM 15 MG PO TABS: 15.0000 mg | ORAL_TABLET | Freq: Every day | ORAL | 0 refills | Status: DC

## 2017-03-20 NOTE — ED Provider Notes (Signed)
[name]    CSN: [id number] Arrival date & time: 03/20/17  1601     History   Chief Complaint Chief Complaint  Patient presents with  . Fatigue  . Otalgia  . Sore Throat    HPI Luis Lynch is a 26 y.o. male.   Patient complains of left ear pain for about a month, sore throat for two weeks, and persistent fatigue for at least two months.  No fevers, chills, and sweats. He has a family history of hypothyroid in his mother and brother.   The history is provided by the patient.    Past Medical History:  Diagnosis Date  . Anxiety   . Nasal turbinate hypertrophy   . PONV (postoperative nausea and vomiting)     Patient Active Problem List   Diagnosis Date Noted  . Chronic rhinitis 09/20/2016  . Possible exposure to STD 12/27/2014  . Idiopathic scoliosis 12/14/2014  . Annual physical exam 09/12/2012  . DEPRESSION 03/03/2010    Past Surgical History:  Procedure Laterality Date  . LEG SURGERY     left  . MANDIBLE SURGERY  2004  . NASAL SEPTOPLASTY W/ TURBINOPLASTY Bilateral 03/30/2014   Procedure: NASAL SEPTOPLASTY WITH BILATERAL TURBINATE REDUCTION;  Surgeon: Darletta Moll, MD;  Location: [facility];  Service: ENT;  Laterality: Bilateral;  . NOSE SURGERY    . WISDOM TOOTH EXTRACTION         Home Medications    Prior to Admission medications   Medication Sig Start Date End Date Taking? Authorizing Provider  meloxicam (MOBIC) 15 MG tablet Take 1 tablet (15 mg total) by mouth daily. Take with food each morning 03/20/17   Lattie Haw, MD  metaxalone [facility]) 800 MG tablet Take one tab by mouth at bedtime 03/20/17   Lattie Haw, MD    Family History Family History  Problem Relation Age of Onset  . Alcohol abuse Mother   . Hypertension Mother   . Heart disease Maternal Grandmother   . Cancer Maternal Grandmother        lung  . Cancer Maternal Grandfather        colon  . Hyperlipidemia Paternal Grandmother   .  Hypertension Paternal Grandmother   . Diabetes Paternal Grandmother   . Cancer Paternal Grandfather        bladder  . Asthma Brother     Social History Social History  Substance Use Topics  . Smoking status: Current Every Day Smoker    Packs/day: 1.00    Years: 2.00    Last attempt to quit: 03/24/2013  . Smokeless tobacco: Never Used  . Alcohol use 0.0 oz/week     Comment: Drinks about one drink per couple months     Allergies   Flexeril [cyclobenzaprine]; Paxil [paroxetine hcl]; and Pollen extract   Review of Systems Review of Systems + sore throat No cough No pleuritic pain No wheezing + nasal congestion + post-nasal drainage No sinus pain/pressure No itchy/red eyes + left earache No hemoptysis No SOB No fever/chills No nausea No vomiting No abdominal pain No diarrhea No urinary symptoms No skin rash + fatigue No myalgias No headache Used OTC meds without relief   Physical Exam Triage Vital Signs ED Triage Vitals  Enc Vitals Group     BP 03/20/17 1635 134/83     Pulse Rate 03/20/17 1635 60     Resp 03/20/17 1635 16     Temp 03/20/17 1635 98.3 F (36.8  C)     Temp Source 03/20/17 1635 Oral     SpO2 03/20/17 1635 97 %     Weight 03/20/17 1636 172 lb (78 kg)     Height 03/20/17 1636 5\' 11"  (1.803 m)     Head Circumference --      Peak Flow --      Pain Score 03/20/17 1636 0     Pain Loc --      Pain Edu? --      Excl. in GC? --    No data found.   Updated Vital Signs BP 134/83 (BP Location: Left Arm)   Pulse 60   Temp 98.3 F (36.8 C) (Oral)   Resp 16   Ht 5\' 11"  (1.803 m)   Wt 172 lb (78 kg)   SpO2 97%   BMI 23.99 kg/m   Visual Acuity Right Eye Distance:   Left Eye Distance:   Bilateral Distance:    Right Eye Near:   Left Eye Near:    Bilateral Near:     Physical Exam Nursing notes and Vital Signs reviewed. Appearance:  Patient appears stated age, and in no acute distress Eyes:  Pupils are equal, round, and reactive to  light and accomodation.  Extraocular movement is intact.  Conjunctivae are not inflamed  Ears:  Canals normal.  Tympanic membranes normal. No tenderness over temporomandibular joints.  Nose:  Normal turbinates.  No sinus tenderness.     Pharynx:  Normal Neck:  Supple.  No adenopathy or thyromegally. Lungs:  Clear to auscultation.  Breath sounds are equal.  Moving air well. Heart:  Regular rate and rhythm without murmurs, rubs, or gallops.  Abdomen:  Nontender without masses or hepatosplenomegaly.  Bowel sounds are present.  No CVA or flank tenderness.  Extremities:  No edema.  Skin:  No rash present.    UC Treatments / Results  Labs (all labs ordered are listed, but only abnormal results are displayed) Labs Reviewed  TSH  T4, FREE  POCT CBC W AUTO DIFF ([facility]):  WBC 8.1; LY 38.7; MO 8.1; GR 53.2; Hgb 16.6; Platelets 245   Tympanometry:  Right ear tympanogram normal; Left ear tympanogram normal  EKG  EKG Interpretation None       Radiology No results found.  Procedures Procedures (including critical care time)  Medications Ordered in UC Medications - No data to display   Initial Impression / Assessment and Plan / UC Course  I have reviewed the triage vital signs and the nursing notes.  Pertinent labs & imaging results that were available during my care of the patient were reviewed by me and considered in my medical decision making (see chart for details).    Normal CBC reassuring. TSH and free T4 pending. Suspect left TMJ arthralgia despite lack of tenderness over left TMJ. Rx for Mobic 15 mg, and Skelaxin at bedtime. Place ice pack on left TMJ for about 15 minutes, 2 to 3 times daily.  Avoid chewy foods. Followup with Dr. Rodney Langtonhomas Thekkekandam     Final Clinical Impressions(s) / UC Diagnoses   Final diagnoses:  Fatigue, unspecified type  Arthralgia of left temporomandibular joint    New Prescriptions New Prescriptions   MELOXICAM (MOBIC) 15 MG  TABLET    Take 1 tablet (15 mg total) by mouth daily. Take with food each morning   METAXALONE (SKELAXIN) 800 MG TABLET    Take one tab by mouth at bedtime     Lattie HawBeese, Sion Reinders A,  MD 03/27/17 1234

## 2017-03-20 NOTE — ED Triage Notes (Signed)
Pt c/o LT ear pain x 4wks, sore throat x 2 wks and fatigues x 2 months.

## 2017-03-20 NOTE — Discharge Instructions (Signed)
Place ice pack on left TMJ for about 15 minutes, 2 to 3 times daily.  Avoid chewy foods.

## 2017-03-21 ENCOUNTER — Telehealth: Payer: Self-pay | Admitting: *Deleted

## 2017-03-21 LAB — T4, FREE: Free T4: 1.2 ng/dL (ref 0.8–1.8)

## 2017-03-21 LAB — TSH: TSH: 1.31 mIU/L (ref 0.40–4.50)

## 2017-03-21 NOTE — Telephone Encounter (Signed)
Callback: No answer, LMOM labs WNL. Per Dr. Cathren HarshBeese f/u with Dr. Benjamin Stainhekkekandam as soon as possible.

## 2017-04-12 ENCOUNTER — Encounter: Payer: Self-pay | Admitting: Sports Medicine

## 2017-04-12 ENCOUNTER — Ambulatory Visit (INDEPENDENT_AMBULATORY_CARE_PROVIDER_SITE_OTHER): Payer: BLUE CROSS/BLUE SHIELD

## 2017-04-12 ENCOUNTER — Ambulatory Visit (INDEPENDENT_AMBULATORY_CARE_PROVIDER_SITE_OTHER): Payer: BLUE CROSS/BLUE SHIELD | Admitting: Sports Medicine

## 2017-04-12 DIAGNOSIS — J209 Acute bronchitis, unspecified: Secondary | ICD-10-CM

## 2017-04-12 DIAGNOSIS — Z0000 Encounter for general adult medical examination without abnormal findings: Secondary | ICD-10-CM | POA: Diagnosis not present

## 2017-04-12 DIAGNOSIS — R05 Cough: Secondary | ICD-10-CM

## 2017-04-12 MED ORDER — AZITHROMYCIN 250 MG PO TABS: ORAL_TABLET | ORAL | 0 refills | Status: DC

## 2017-04-12 MED ORDER — HYDROCODONE-HOMATROPINE 5-1.5 MG/5ML PO SYRP: 5.0000 mL | ORAL_SOLUTION | Freq: Three times a day (TID) | ORAL | 0 refills | Status: DC | PRN

## 2017-04-12 MED ORDER — PREDNISONE 50 MG PO TABS: 50.0000 mg | ORAL_TABLET | Freq: Every day | ORAL | 0 refills | Status: DC

## 2017-04-12 NOTE — Progress Notes (Signed)
  Subjective:    CC: Coughing  HPI: For 3 weeks now this pleasant 26 year old male has had a persistent cough, initially dry but now productive of greenish and yellowish sputum, no blood. Low-grade fatigue. No shortness of breath, chest pain, no rash. No GI symptoms. He has tried over-the-counter antitussives and cold and flu medications without much improvement.  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  Past medical history:  Negative.  See flowsheet/record as well for more information.  Surgical history: Negative.  See flowsheet/record as well for more information.  Family history: Negative.  See flowsheet/record as well for more information.  Social history: Negative.  See flowsheet/record as well for more information.  Allergies, and medications have been entered into the medical record, reviewed, and no changes needed.   Review of Systems: No fevers, chills, night sweats, weight loss, chest pain, or shortness of breath.   Objective:    General: Well Developed, well nourished, and in no acute distress.  Neuro: Alert and oriented x3, extra-ocular muscles intact, sensation grossly intact.  HEENT: Normocephalic, atraumatic, pupils equal round reactive to light, neck supple, no masses, no lymphadenopathy, thyroid nonpalpable. Oropharynx, nasopharynx, ear canals unremarkable. Skin: Warm and dry, no rashes. Cardiac: Regular rate and rhythm, no murmurs rubs or gallops, no lower extremity edema.  Respiratory: Coarse sounds in the bases bilaterally. Not using accessory muscles, speaking in full sentences.  Impression and Recommendations:    Acute bronchitis Persistent cough now, productive for 3 weeks. Failed conservative measures, azithromycin, prednisone, Hycodan syrup. Chest x-ray.   Annual physical exam Needs hepatitis B immunity titers as well as tuberculosis testing for his CNA class.  I spent 25 minutes with this patient, greater than 50% was  face-to-face time counseling regarding the above diagnoses

## 2017-04-12 NOTE — Assessment & Plan Note (Signed)
Persistent cough now, productive for 3 weeks. Failed conservative measures, azithromycin, prednisone, Hycodan syrup. Chest x-ray.

## 2017-04-12 NOTE — Assessment & Plan Note (Signed)
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

## 2017-04-13 LAB — HEPATITIS B SURFACE ANTIBODY, QUANTITATIVE: Hep B S AB Quant (Post): <5 m[IU]/mL

## 2017-04-13 LAB — QUANTIFERON TB GOLD ASSAY (BLOOD)
Interferon Gamma Release Assay: NEGATIVE
Mitogen-Nil: 9.41 IU/mL
Quantiferon Nil Value: 0.04 IU/mL
Quantiferon Tb Ag Minus Nil Value: 0.01 IU/mL

## 2017-04-15 LAB — MEASLES/MUMPS/RUBELLA IMMUNITY
Mumps IgG: 69.1 AU/mL — ABNORMAL HIGH (ref <9.00)
Rubella: <0.9 Index (ref <0.90)
Rubeola IgG: 153 AU/mL — ABNORMAL HIGH (ref <25.00)

## 2017-04-15 LAB — VARICELLA ZOSTER ANTIBODY, IGG: Varicella IgG: 748.5 Index — ABNORMAL HIGH (ref <135.00)

## 2017-04-19 ENCOUNTER — Ambulatory Visit (INDEPENDENT_AMBULATORY_CARE_PROVIDER_SITE_OTHER): Payer: BLUE CROSS/BLUE SHIELD | Admitting: Sports Medicine

## 2017-04-19 VITALS — BP 115/68 | HR 69 | Temp 97.9°F

## 2017-04-19 DIAGNOSIS — Z23 Encounter for immunization: Secondary | ICD-10-CM | POA: Diagnosis not present

## 2017-04-19 NOTE — Progress Notes (Signed)
Pt came into clinic today to get MMR and TwinRix vaccines. 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 Pt tolerated immunizations well, no immediate complications. Pt advised of vaccine schedule for both immunizations. He will call clinic to schedule his one month injections. Pt is not in [place].

## 2017-09-06 ENCOUNTER — Ambulatory Visit (INDEPENDENT_AMBULATORY_CARE_PROVIDER_SITE_OTHER): Payer: BLUE CROSS/BLUE SHIELD | Admitting: Sports Medicine

## 2017-09-06 VITALS — BP 117/67 | HR 67

## 2017-09-06 DIAGNOSIS — Z23 Encounter for immunization: Secondary | ICD-10-CM | POA: Diagnosis not present

## 2017-09-06 NOTE — Progress Notes (Signed)
Pt came into clinic today to complete the MMR vaccine, and get his second twinrix. Pt reports no negative side effects from initial immunizations. Tolerated MMR vaccine in right deltoid, and Twinrix vaccine in left deltoid well - no immediate complications. Pt advised to return to clinic in 2 months for final twinrix vaccine.

## 2017-09-12 ENCOUNTER — Ambulatory Visit: Payer: BLUE CROSS/BLUE SHIELD | Admitting: Sports Medicine

## 2017-09-12 ENCOUNTER — Encounter: Payer: Self-pay | Admitting: Sports Medicine

## 2017-09-12 DIAGNOSIS — F419 Anxiety disorder, unspecified: Secondary | ICD-10-CM | POA: Diagnosis not present

## 2017-09-12 DIAGNOSIS — F329 Major depressive disorder, single episode, unspecified: Secondary | ICD-10-CM | POA: Diagnosis not present

## 2017-09-12 DIAGNOSIS — F32A Depression, unspecified: Secondary | ICD-10-CM

## 2017-09-12 MED ORDER — DULOXETINE HCL 30 MG PO CPEP: 30.0000 mg | ORAL_CAPSULE | Freq: Every day | ORAL | 3 refills | Status: DC

## 2017-09-12 NOTE — Progress Notes (Signed)
  Subjective:    CC: Increasing anxiety  HPI: This is a pleasant 26 year old male, I have treated him for anxiety for many years now, we have not really had much of a duration on any particular medication.  More recently he started waking up with panic attacks, he is in nursing school, struggling, and would like treatment.  He is not interested in behavioral therapy, would would like to try medication.  No suicidal or homicidal ideation.  Past medical history:  Negative.  See flowsheet/record as well for more information.  Surgical history: Negative.  See flowsheet/record as well for more information.  Family history: Negative.  See flowsheet/record as well for more information.  Social history: Negative.  See flowsheet/record as well for more information.  Allergies, and medications have been entered into the medical record, reviewed, and no changes needed.   Review of Systems: No fevers, chills, night sweats, weight loss, chest pain, or shortness of breath.   Objective:    General: Well Developed, well nourished, and in no acute distress.  Neuro: Alert and oriented x3, extra-ocular muscles intact, sensation grossly intact.  HEENT: Normocephalic, atraumatic, pupils equal round reactive to light, neck supple, no masses, no lymphadenopathy, thyroid nonpalpable.  Skin: Warm and dry, no rashes. Cardiac: Regular rate and rhythm, no murmurs rubs or gallops, no lower extremity edema.  Respiratory: Clear to auscultation bilaterally. Not using accessory muscles, speaking in full sentences.  Impression and Recommendations:    Anxiety and depression Currently in nursing school, adding Cymbalta. Return monthly for a PHQ/GAD. Declines psychotherapy for now.  I spent 25 minutes with this patient, greater than 50% was face-to-face time counseling regarding the above diagnoses ___________________________________________ Luis Lynch, M.D., ABFM., CAQSM. Primary Care and Sports  Medicine [facility]  Adjunct Instructor of Family Medicine  [employer]

## 2017-09-12 NOTE — Assessment & Plan Note (Signed)
Currently in nursing school, adding Cymbalta. Return monthly for a PHQ/GAD. Declines psychotherapy for now.

## 2018-01-11 IMAGING — DX DG CHEST 2V
2 series · 2 of 2 positions shown · non-contrast
Comparison: None.

CLINICAL DATA: Cough for 2 weeks.

EXAM:
CHEST  2 VIEW

[chest pa]
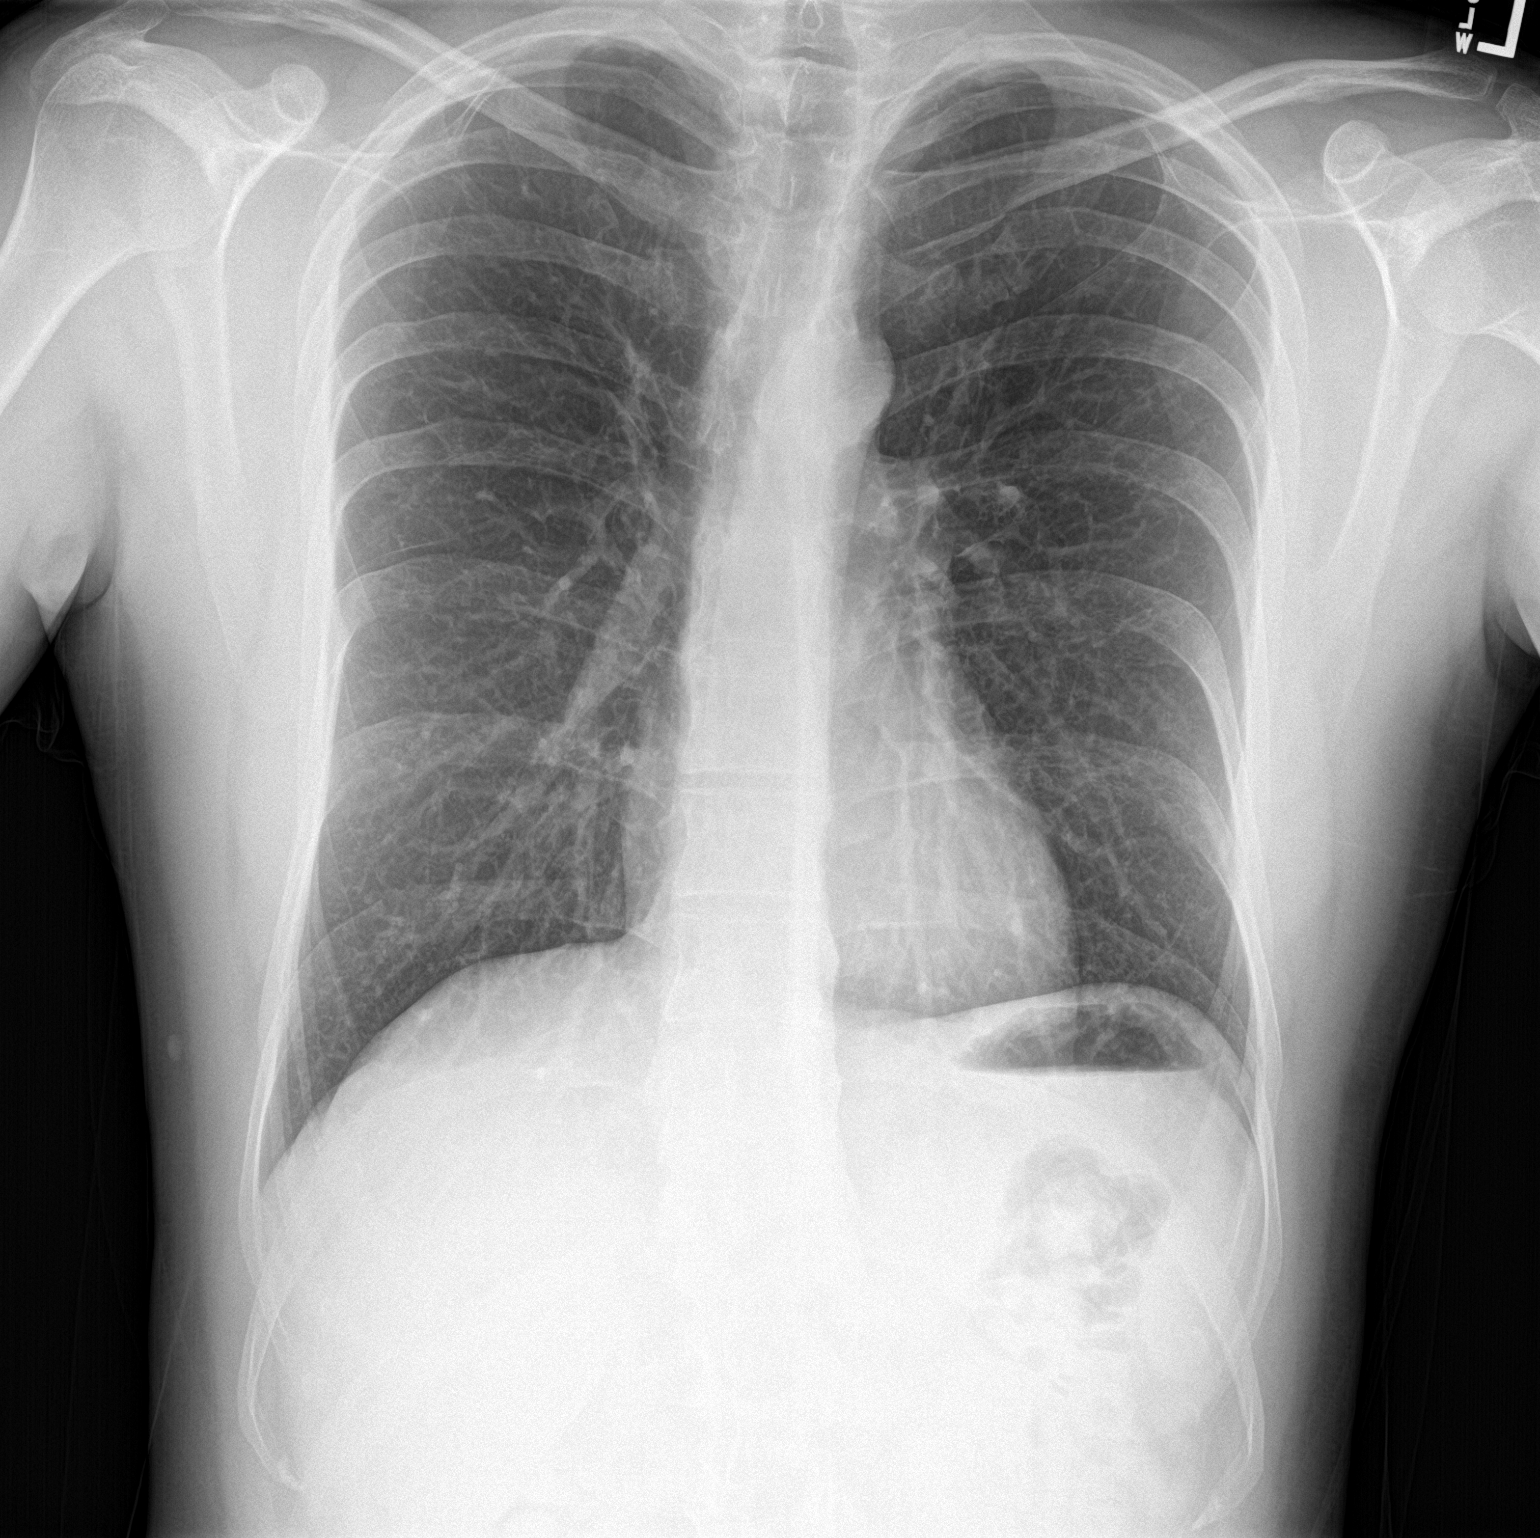

[chest lat]
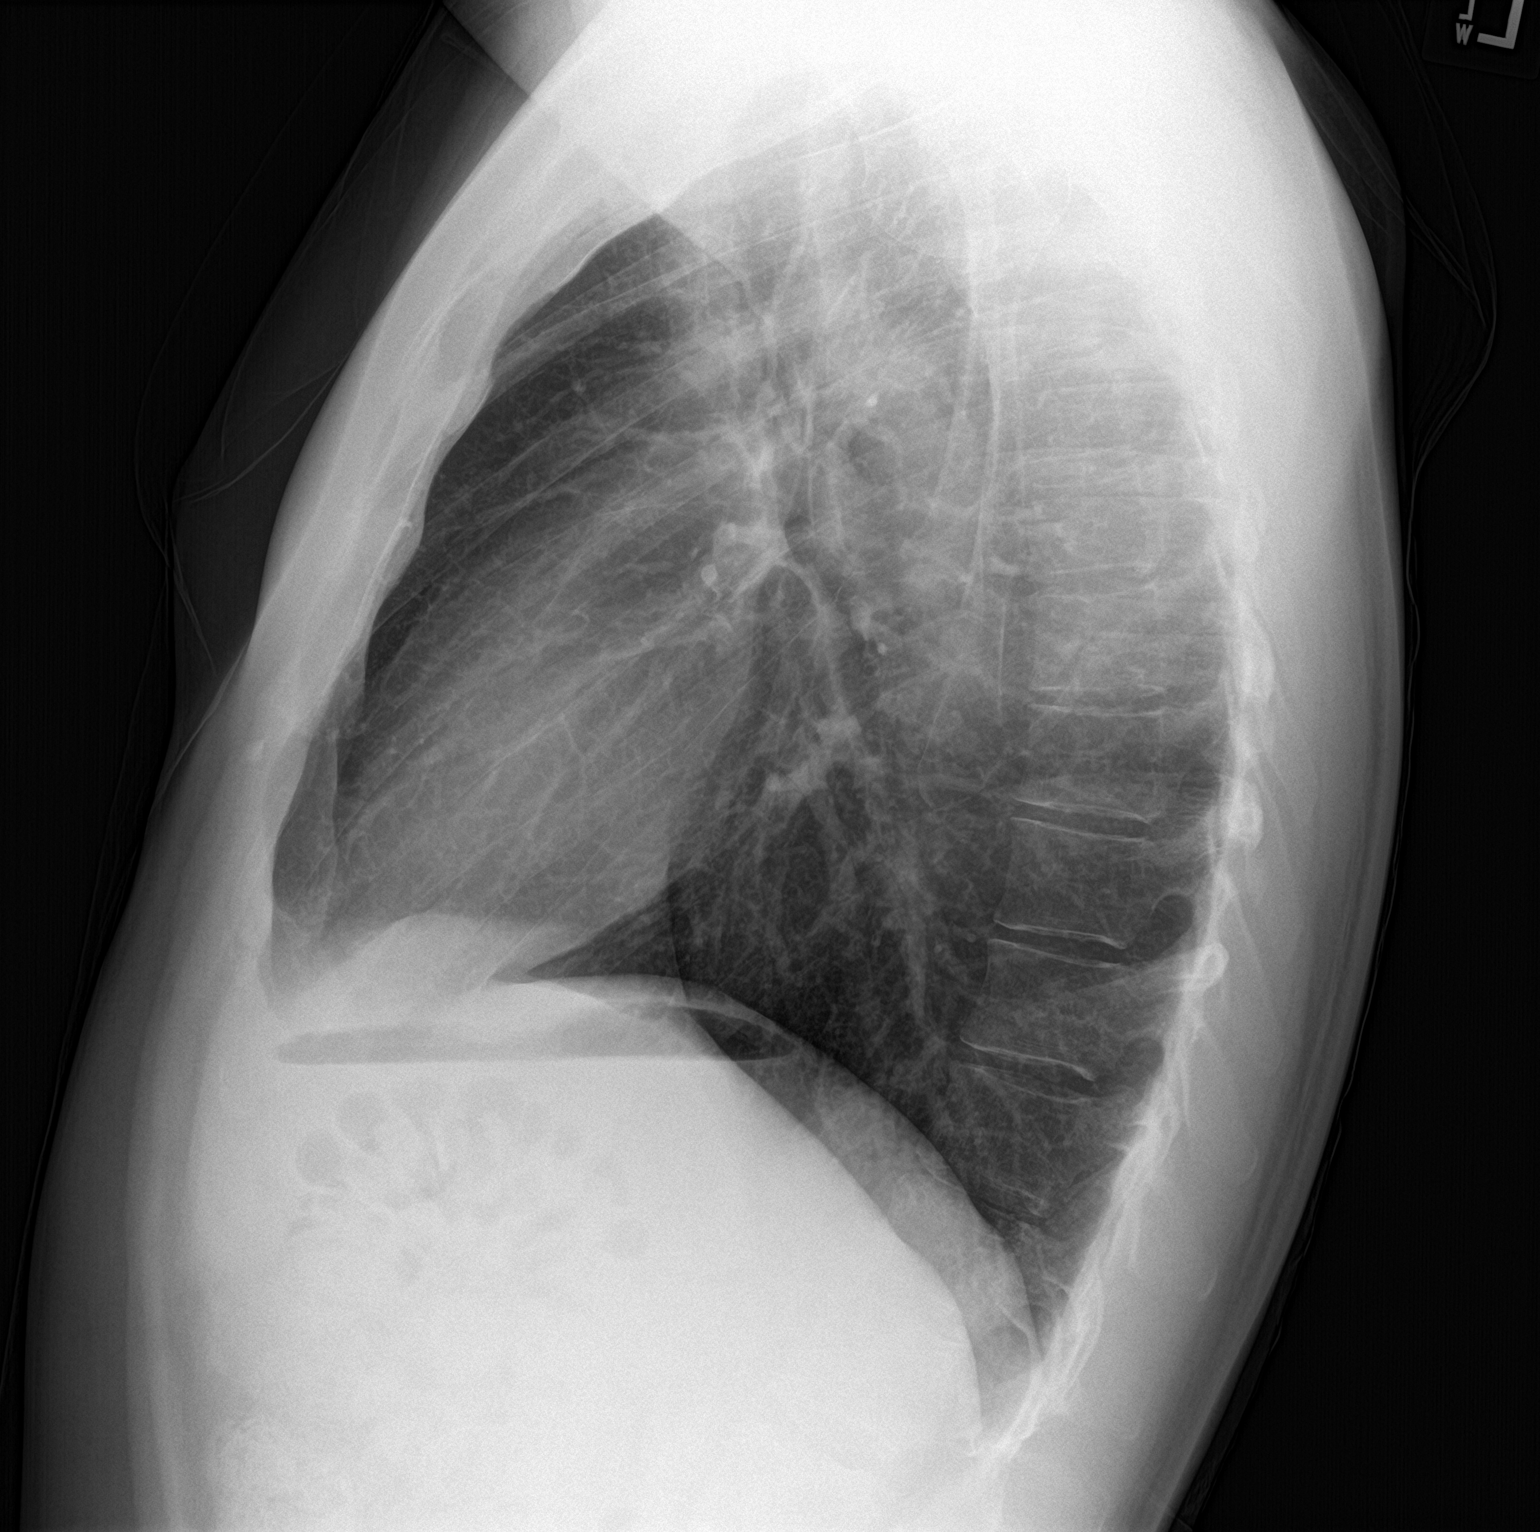

[2 of 2 positions shown; findings below may reference images not displayed]

FINDINGS: The heart, hila, mediastinum, lungs, and pleura are normal. No acute
abnormalities.
IMPRESSION: No active cardiopulmonary disease.

## 2018-07-24 ENCOUNTER — Other Ambulatory Visit: Payer: Self-pay

## 2018-07-24 ENCOUNTER — Emergency Department
Admission: EM | Admit: 2018-07-24 | Discharge: 2018-07-24 | Disposition: A | Discharge status: Home / Self Care | Payer: Self-pay | Source: Home / Self Care | Attending: Family Medicine | Admitting: Family Medicine

## 2018-07-24 ENCOUNTER — Encounter: Payer: Self-pay | Admitting: *Deleted

## 2018-07-24 DIAGNOSIS — Z202 Contact with and (suspected) exposure to infections with a predominantly sexual mode of transmission: Secondary | ICD-10-CM

## 2018-07-24 NOTE — ED Triage Notes (Addendum)
Patient reports potential std exposure about 3 weeks ago. He wants to be tested. He is asymptomatic.  Password: Osborne CascoNadia

## 2018-07-24 NOTE — ED Provider Notes (Signed)
[facility]    CSN: [id number] Arrival date & time: 07/24/18  1539     History   Chief Complaint Chief Complaint  Patient presents with  . STD Screening    HPI Luis Lynch is a 27 y.o. male.   Patient reports potential STD exposure about 3 weeks ago and wishes to be tested.  He is completely assymptomatic.  The history is provided by the patient.    Past Medical History:  Diagnosis Date  . Anxiety   . Nasal turbinate hypertrophy   . PONV (postoperative nausea and vomiting)     Patient Active Problem List   Diagnosis Date Noted  . Chronic rhinitis 09/20/2016  . Possible exposure to STD 12/27/2014  . Idiopathic scoliosis 12/14/2014  . Annual physical exam 09/12/2012  . Anxiety and depression 03/03/2010    Past Surgical History:  Procedure Laterality Date  . LEG SURGERY     left  . MANDIBLE SURGERY  2004  . NASAL SEPTOPLASTY W/ TURBINOPLASTY Bilateral 03/30/2014   Procedure: NASAL SEPTOPLASTY WITH BILATERAL TURBINATE REDUCTION;  Surgeon: Darletta MollSui W Teoh, MD;  Location: [facility];  Service: ENT;  Laterality: Bilateral;  . NOSE SURGERY    . WISDOM TOOTH EXTRACTION         Home Medications    Prior to Admission medications   Medication Sig Start Date End Date Taking? Authorizing Provider  venlafaxine XR (EFFEXOR XR) 37.5 MG 24 hr capsule Take 1 capsule (37.5 mg total) by mouth daily with breakfast. 11/24/13 12/14/14  Monica Bectonhekkekandam, Thomas J, MD    Family History Family History  Problem Relation Age of Onset  . Alcohol abuse Mother   . Hypertension Mother   . Heart disease Maternal Grandmother   . Cancer Maternal Grandmother        lung  . Cancer Maternal Grandfather        colon  . Hyperlipidemia Paternal Grandmother   . Hypertension Paternal Grandmother   . Diabetes Paternal Grandmother   . Cancer Paternal Grandfather        bladder  . Asthma Brother     Social History Social History   Tobacco Use  . Smoking  status: Former Smoker    Packs/day: 1.00    Years: 2.00    Pack years: 2.00    Last attempt to quit: 03/17/2013    Years since quitting: 5.3  . Smokeless tobacco: Never Used  Substance Use Topics  . Alcohol use: Yes    Alcohol/week: 0.0 standard drinks    Comment: Drinks about one drink per couple months  . Drug use: No     Allergies   Flexeril [cyclobenzaprine]; Paxil [paroxetine hcl]; and Pollen extract   Review of Systems Review of Systems  Constitutional: Negative.   HENT: Negative.   Eyes: Negative.   Respiratory: Negative.   Cardiovascular: Negative.   Gastrointestinal: Negative.   Genitourinary: Negative.   Musculoskeletal: Negative.   Skin: Negative.   Neurological: Negative.      Physical Exam Triage Vital Signs ED Triage Vitals [07/24/18 1602]  Enc Vitals Group     BP (!) 144/92     Pulse Rate 84     Resp      Temp 97.8 F (36.6 C)     Temp Source Oral     SpO2 97 %     Weight 168 lb (76.2 kg)     Height      Head Circumference  Peak Flow      Pain Score 0     Pain Loc      Pain Edu?      Excl. in GC?    No data found.  Updated Vital Signs BP (!) 144/92 (BP Location: Right Arm)   Pulse 84   Temp 97.8 F (36.6 C) (Oral)   Wt 76.2 kg   SpO2 97%   BMI 23.43 kg/m   Visual Acuity Right Eye Distance:   Left Eye Distance:   Bilateral Distance:    Right Eye Near:   Left Eye Near:    Bilateral Near:     Physical Exam Nursing notes and Vital Signs reviewed. Appearance:  Patient appears stated age, and in no acute distress.    Eyes:  Pupils are equal, round, and reactive to light and accomodation.  Extraocular movement is intact.  Conjunctivae are not inflamed   Pharynx:  Normal; moist mucous membranes  Neck:  Supple.  No adenopathy Lungs:  Clear to auscultation.  Breath sounds are equal.  Moving air well. Heart:  Regular rate and rhythm without murmurs, rubs, or gallops.  Abdomen:  Nontender without masses or hepatosplenomegaly.   Bowel sounds are present.  No CVA or flank tenderness.  GU:  Normal penis/testes.  No urethral discharge.  No rash. Extremities:  No edema.  Skin:  No rash present.     UC Treatments / Results  Labs (all labs ordered are listed, but only abnormal results are displayed) Labs Reviewed  C. TRACHOMATIS/N. GONORRHOEAE RNA  HIV ANTIBODY (ROUTINE TESTING W REFLEX)  RPR    EKG None  Radiology No results found.  Procedures Procedures (including critical care time)  Medications Ordered in UC Medications - No data to display  Initial Impression / Assessment and Plan / UC Course  I have reviewed the triage vital signs and the nursing notes.  Pertinent labs & imaging results that were available during my care of the patient were reviewed by me and considered in my medical decision making (see chart for details).    GC/chlamydia, HIV, RPR pending. Followup with Family Doctor as needed.   Final Clinical Impressions(s) / UC Diagnoses   Final diagnoses:  Possible exposure to STD   Discharge Instructions   None    ED Prescriptions    None        Lattie Haw, MD 07/27/18 2034

## 2018-07-25 ENCOUNTER — Telehealth: Payer: Self-pay | Admitting: Emergency Medicine

## 2018-07-25 LAB — C. TRACHOMATIS/N. GONORRHOEAE RNA
C. trachomatis RNA, TMA: NOT DETECTED
N. gonorrhoeae RNA, TMA: NOT DETECTED

## 2018-07-25 LAB — RPR: RPR Ser Ql: NONREACTIVE

## 2018-07-25 LAB — HIV ANTIBODY (ROUTINE TESTING W REFLEX): HIV 1&2 Ab, 4th Generation: NONREACTIVE

## 2018-07-25 NOTE — Telephone Encounter (Signed)
Password: Osborne Cascoadia, All labs Negative

## 2018-08-20 ENCOUNTER — Encounter: Payer: Self-pay | Admitting: Emergency Medicine

## 2018-08-20 ENCOUNTER — Other Ambulatory Visit: Payer: Self-pay

## 2018-08-20 ENCOUNTER — Emergency Department
Admission: EM | Admit: 2018-08-20 | Discharge: 2018-08-20 | Disposition: A | Discharge status: Home / Self Care | Payer: BLUE CROSS/BLUE SHIELD | Source: Home / Self Care | Attending: Emergency Medicine | Admitting: Emergency Medicine

## 2018-08-20 DIAGNOSIS — Z202 Contact with and (suspected) exposure to infections with a predominantly sexual mode of transmission: Secondary | ICD-10-CM

## 2018-08-20 NOTE — ED Provider Notes (Signed)
[name]    CSN: [id number] Arrival date & time: 08/20/18  0857     History   Chief Complaint Chief Complaint  Patient presents with  . Exposure to STD    HPI Luis Lynch is a 27 y.o. male.   HPI Had sex without protection on 07/14/2018.  Was seen here 07/24/2018 with negative HIV tests, negative RPR, negative chlamydia and negative GC tests. He has had some vague fatigue but no fever or chills.  He feels fatigue has resolved back to baseline.  Denies rash except for minimal acne on face.  Denies any GU symptoms.  No dysuria or hematuria or urethral discharge or GU lesions. Chief complaint today is requesting another HIV test because of the exposure 5 weeks ago 07/14/2018.  He does not know if that sexual partner has any symptoms or any STD diagnoses. Currently denies fever or chills or nausea or vomiting or chest pain or shortness of breath or change of bowel habits.  No abdominal pain. Past Medical History:  Diagnosis Date  . Anxiety   . Nasal turbinate hypertrophy   . PONV (postoperative nausea and vomiting)     Patient Active Problem List   Diagnosis Date Noted  . Chronic rhinitis 09/20/2016  . Possible exposure to STD 12/27/2014  . Idiopathic scoliosis 12/14/2014  . Annual physical exam 09/12/2012  . Anxiety and depression 03/03/2010    Past Surgical History:  Procedure Laterality Date  . LEG SURGERY     left  . MANDIBLE SURGERY  2004  . NASAL SEPTOPLASTY W/ TURBINOPLASTY Bilateral 03/30/2014   Procedure: NASAL SEPTOPLASTY WITH BILATERAL TURBINATE REDUCTION;  Surgeon: Darletta Moll, MD;  Location: [facility];  Service: ENT;  Laterality: Bilateral;  . NOSE SURGERY    . WISDOM TOOTH EXTRACTION         Home Medications    Prior to Admission medications   Not on File    Family History Family History  Problem Relation Age of Onset  . Alcohol abuse Mother   . Hypertension Mother   . Heart disease Maternal Grandmother   .  Cancer Maternal Grandmother        lung  . Cancer Maternal Grandfather        colon  . Hyperlipidemia Paternal Grandmother   . Hypertension Paternal Grandmother   . Diabetes Paternal Grandmother   . Cancer Paternal Grandfather        bladder  . Asthma Brother     Social History Social History   Tobacco Use  . Smoking status: Former Smoker    Packs/day: 1.00    Years: 2.00    Pack years: 2.00    Last attempt to quit: 03/17/2013    Years since quitting: 5.4  . Smokeless tobacco: Never Used  Substance Use Topics  . Alcohol use: Yes    Alcohol/week: 0.0 standard drinks    Comment: Drinks about one drink per couple months  . Drug use: No     Allergies   Flexeril [cyclobenzaprine]; Paxil [paroxetine hcl]; and Pollen extract   Review of Systems Review of Systems Pertinent items noted in HPI and remainder of comprehensive ROS otherwise negative.   Physical Exam Triage Vital Signs ED Triage Vitals  Enc Vitals Group     BP 08/20/18 0920 116/75     Pulse Rate 08/20/18 0920 70     Resp 08/20/18 0920 16     Temp 08/20/18 0920 98 F (36.7 C)  Temp Source 08/20/18 0920 Oral     SpO2 08/20/18 0920 98 %     Weight 08/20/18 0921 168 lb (76.2 kg)     Height 08/20/18 0921 6' (1.829 m)     Head Circumference --      Peak Flow --      Pain Score 08/20/18 0920 0     Pain Loc --      Pain Edu? --      Excl. in GC? --    No data found.  Updated Vital Signs BP 116/75 (BP Location: Right Arm)   Pulse 70   Temp 98 F (36.7 C) (Oral)   Resp 16   Ht 6' (1.829 m)   Wt 76.2 kg   SpO2 98%   BMI 22.78 kg/m   Visual Acuity Right Eye Distance:   Left Eye Distance:   Bilateral Distance:    Right Eye Near:   Left Eye Near:    Bilateral Near:     Physical Exam  Constitutional: He is oriented to person, place, and time. He appears well-developed and well-nourished. No distress.  HENT:  Head: Normocephalic and atraumatic.  Eyes: Pupils are equal, round, and reactive to  light. No scleral icterus.  Neck: Normal range of motion. Neck supple.  No lymphadenopathy  Cardiovascular: Normal rate and regular rhythm.  Pulmonary/Chest: Effort normal.  Abdominal: He exhibits no distension.  Neurological: He is alert and oriented to person, place, and time. No cranial nerve deficit.  Skin: Skin is warm and dry.  Minimal facial acne, but no other lesions.  Psychiatric: He has a normal mood and affect. His behavior is normal.  Vitals reviewed.    UC Treatments / Results  Labs (all labs ordered are listed, but only abnormal results are displayed) Labs Reviewed  HIV ANTIBODY (ROUTINE TESTING W REFLEX)    EKG None  Radiology No results found.  Procedures Procedures (including critical care time)  Medications Ordered in UC Medications - No data to display  Initial Impression / Assessment and Plan / UC Course  I have reviewed the triage vital signs and the nursing notes.  Pertinent labs & imaging results that were available during my care of the patient were reviewed by me and considered in my medical decision making (see chart for details).    Discussed options at length.  Reviewed at length the negative tests from 07/24/2018, and I gave him printed copy of these. I agree that he needs a follow-up HIV test because of history of sexual exposure 5 weeks ago.  But he is asymptomatic. We will draw a HIV test.  He declined any other testing. I reinforced the importance of safe sex and advised follow-up appointment and/or wellness exam with Dr. [name], his PCP within 1 month. Other advice given. Questions invited and answered. Final Clinical Impressions(s) / UC Diagnoses   Final diagnoses:  Possible exposure to STD   Discharge Instructions   None    ED Prescriptions    None     Controlled Substance Prescriptions [place] Controlled Substance Registry consulted? Not Applicable   Lajean Manes, MD 08/20/18 [phone]

## 2018-08-20 NOTE — ED Triage Notes (Signed)
Patient states he was tested for STDS 07/24/18  Which was one week after exposure; all labs were wnl, but now he has been unusually fatigued and wonders if something was missed.

## 2018-08-21 ENCOUNTER — Telehealth: Payer: Self-pay | Admitting: *Deleted

## 2018-08-21 LAB — HIV ANTIBODY (ROUTINE TESTING W REFLEX): HIV 1&2 Ab, 4th Generation: NONREACTIVE

## 2018-08-21 NOTE — Telephone Encounter (Signed)
Patient password received. "[email or web address]". Lab results given.

## 2018-10-06 ENCOUNTER — Ambulatory Visit: Payer: BLUE CROSS/BLUE SHIELD | Admitting: Sports Medicine

## 2018-10-07 ENCOUNTER — Ambulatory Visit (INDEPENDENT_AMBULATORY_CARE_PROVIDER_SITE_OTHER): Payer: BLUE CROSS/BLUE SHIELD | Admitting: Sports Medicine

## 2018-10-07 ENCOUNTER — Encounter: Payer: Self-pay | Admitting: Sports Medicine

## 2018-10-07 DIAGNOSIS — J111 Influenza due to unidentified influenza virus with other respiratory manifestations: Secondary | ICD-10-CM | POA: Insufficient documentation

## 2018-10-07 DIAGNOSIS — R69 Illness, unspecified: Secondary | ICD-10-CM | POA: Diagnosis not present

## 2018-10-07 DIAGNOSIS — Z202 Contact with and (suspected) exposure to infections with a predominantly sexual mode of transmission: Secondary | ICD-10-CM

## 2018-10-07 DIAGNOSIS — Z0000 Encounter for general adult medical examination without abnormal findings: Secondary | ICD-10-CM

## 2018-10-07 LAB — POCT INFLUENZA A/B
Influenza A, POC: NEGATIVE
Influenza B, POC: NEGATIVE

## 2018-10-07 NOTE — Progress Notes (Signed)
Subjective:    CC: Feeling sick  HPI: For the past 5 days this pleasant 27 year old male has had fatigue, mild myalgias, headache, sore throat.  Mild cough, he vomited once, but otherwise no GI symptoms, skin rash, no overt fevers.  Multiple sick contacts, he does work as an Museum/gallery exhibitions officerMT.  No neck stiffness.  In addition he does report possible recent exposure to an STD, possibly HIV.  He would like a full STD screen.  I reviewed the past medical history, family history, social history, surgical history, and allergies today and no changes were needed.  Please see the problem list section below in epic for further details.  Past Medical History: Past Medical History:  Diagnosis Date  . Anxiety   . Nasal turbinate hypertrophy   . PONV (postoperative nausea and vomiting)    Past Surgical History: Past Surgical History:  Procedure Laterality Date  . LEG SURGERY     left  . MANDIBLE SURGERY  2004  . NASAL SEPTOPLASTY W/ TURBINOPLASTY Bilateral 03/30/2014   Procedure: NASAL SEPTOPLASTY WITH BILATERAL TURBINATE REDUCTION;  Surgeon: Darletta MollSui W Teoh, MD;  Location: [facility];  Service: ENT;  Laterality: Bilateral;  . NOSE SURGERY    . WISDOM TOOTH EXTRACTION     Social History: Social History   Socioeconomic History  . Marital status: Single    Spouse name: Not on file  . Number of children: Not on file  . Years of education: Not on file  . Highest education level: Not on file  Occupational History  . Not on file  Social Needs  . Financial resource strain: Not on file  . Food insecurity:    Worry: Not on file    Inability: Not on file  . Transportation needs:    Medical: Not on file    Non-medical: Not on file  Tobacco Use  . Smoking status: Former Smoker    Packs/day: 1.00    Years: 2.00    Pack years: 2.00    Last attempt to quit: 03/17/2013    Years since quitting: 5.5  . Smokeless tobacco: Never Used  Substance and Sexual Activity  . Alcohol use: Yes     Alcohol/week: 0.0 standard drinks    Comment: Drinks about one drink per couple months  . Drug use: No  . Sexual activity: Never  Lifestyle  . Physical activity:    Days per week: Not on file    Minutes per session: Not on file  . Stress: Not on file  Relationships  . Social connections:    Talks on phone: Not on file    Gets together: Not on file    Attends religious service: Not on file    Active member of club or organization: Not on file    Attends meetings of clubs or organizations: Not on file    Relationship status: Not on file  Other Topics Concern  . Not on file  Social History Narrative  . Not on file   Family History: Family History  Problem Relation Age of Onset  . Alcohol abuse Mother   . Hypertension Mother   . Heart disease Maternal Grandmother   . Cancer Maternal Grandmother        lung  . Cancer Maternal Grandfather        colon  . Hyperlipidemia Paternal Grandmother   . Hypertension Paternal Grandmother   . Diabetes Paternal Grandmother   . Cancer Paternal Grandfather  bladder  . Asthma Brother    Allergies: Allergies  Allergen Reactions  . Flexeril [Cyclobenzaprine]   . Paxil [Paroxetine Hcl]   . Pollen Extract    Medications: See med rec.  Review of Systems: No fevers, chills, night sweats, weight loss, chest pain, or shortness of breath.   Objective:    General: Well Developed, well nourished, and in no acute distress.  Neuro: Alert and oriented x3, extra-ocular muscles intact, sensation grossly intact.  HEENT: Normocephalic, atraumatic, pupils equal round reactive to light, neck supple, no masses, no lymphadenopathy, thyroid nonpalpable.  Oropharynx, nasopharynx, ear canals unremarkable. Skin: Warm and dry, no rashes. Cardiac: Regular rate and rhythm, no murmurs rubs or gallops, no lower extremity edema.  Respiratory: Clear to auscultation bilaterally. Not using accessory muscles, speaking in full sentences.  Impression and  Recommendations:    Influenza-like illness Flu swab today. Relative rest, hydration, over-the-counter cold and flu medications, he is out of the window for antivirals.  Possible exposure to STD Possible exposure to STD, doing another full work-up.  Annual physical exam Up-to-date on screening measures, also adding routine labs including lipids. He is currently working as an Museum/gallery exhibitions officer. ___________________________________________ Luis Lynch. Luis Lynch, M.D., ABFM., CAQSM. Primary Care and Sports Medicine [facility]  Adjunct Professor of Family Medicine  [employer]

## 2018-10-07 NOTE — Assessment & Plan Note (Signed)
Flu swab today. Relative rest, hydration, over-the-counter cold and flu medications, he is out of the window for antivirals.

## 2018-10-07 NOTE — Assessment & Plan Note (Signed)
Possible exposure to STD, doing another full work-up.

## 2018-10-07 NOTE — Addendum Note (Signed)
Addended by: Donne AnonBENDER, Amonie Wisser L on: 10/07/2018 11:51 AM   Modules accepted: Orders

## 2018-10-07 NOTE — Assessment & Plan Note (Signed)
Up-to-date on screening measures, also adding routine labs including lipids. He is currently working as an Museum/gallery exhibitions officerMT.

## 2018-10-08 LAB — COMPREHENSIVE METABOLIC PANEL
AG Ratio: 2 (calc) (ref 1.0–2.5)
ALT: 23 U/L (ref 9–46)
AST: 22 U/L (ref 10–40)
Albumin: 4.9 g/dL (ref 3.6–5.1)
Alkaline phosphatase (APISO): 67 U/L (ref 40–115)
BUN: 19 mg/dL (ref 7–25)
CO2: 27 mmol/L (ref 20–32)
Calcium: 10.1 mg/dL (ref 8.6–10.3)
Chloride: 105 mmol/L (ref 98–110)
Creat: 0.85 mg/dL (ref 0.60–1.35)
Globulin: 2.4 g/dL (calc) (ref 1.9–3.7)
Glucose, Bld: 105 mg/dL — ABNORMAL HIGH (ref 65–99)
Potassium: 3.9 mmol/L (ref 3.5–5.3)
Sodium: 141 mmol/L (ref 135–146)
Total Bilirubin: 0.7 mg/dL (ref 0.2–1.2)
Total Protein: 7.3 g/dL (ref 6.1–8.1)

## 2018-10-08 LAB — CBC
HCT: 48.7 % (ref 38.5–50.0)
Hemoglobin: 17 g/dL (ref 13.2–17.1)
MCH: 30.3 pg (ref 27.0–33.0)
MCHC: 34.9 g/dL (ref 32.0–36.0)
MCV: 86.8 fL (ref 80.0–100.0)
MPV: 12.8 fL — ABNORMAL HIGH (ref 7.5–12.5)
Platelets: 260 10*3/uL (ref 140–400)
RBC: 5.61 10*6/uL (ref 4.20–5.80)
RDW: 12.9 % (ref 11.0–15.0)
WBC: 9.1 10*3/uL (ref 3.8–10.8)

## 2018-10-08 LAB — HSV 2 ANTIBODY, IGG: HSV 2 Glycoprotein G Ab, IgG: <0.9 index

## 2018-10-08 LAB — TSH: TSH: 0.65 mIU/L (ref 0.40–4.50)

## 2018-10-08 LAB — HIV ANTIBODY (ROUTINE TESTING W REFLEX): HIV 1&2 Ab, 4th Generation: NONREACTIVE

## 2018-10-08 LAB — RPR: RPR Ser Ql: NONREACTIVE

## 2018-10-08 LAB — LIPID PANEL W/REFLEX DIRECT LDL
Cholesterol: 151 mg/dL (ref <200)
HDL: 41 mg/dL (ref >40)
LDL Cholesterol (Calc): 85 mg/dL (calc)
Non-HDL Cholesterol (Calc): 110 mg/dL (calc) (ref <130)
Total CHOL/HDL Ratio: 3.7 (calc) (ref <5.0)
Triglycerides: 152 mg/dL — ABNORMAL HIGH (ref <150)

## 2018-10-08 LAB — HEPATITIS PANEL, ACUTE
Hep A IgM: NONREACTIVE
Hep B C IgM: NONREACTIVE
Hepatitis B Surface Ag: NONREACTIVE
Hepatitis C Ab: NONREACTIVE
SIGNAL TO CUT-OFF: 0.03 (ref <1.00)

## 2018-11-22 ENCOUNTER — Other Ambulatory Visit: Payer: Self-pay

## 2018-11-22 ENCOUNTER — Emergency Department
Admission: EM | Admit: 2018-11-22 | Discharge: 2018-11-22 | Disposition: A | Discharge status: Home / Self Care | Payer: BLUE CROSS/BLUE SHIELD | Source: Home / Self Care | Attending: Family Medicine | Admitting: Family Medicine

## 2018-11-22 DIAGNOSIS — J069 Acute upper respiratory infection, unspecified: Secondary | ICD-10-CM | POA: Diagnosis not present

## 2018-11-22 DIAGNOSIS — J029 Acute pharyngitis, unspecified: Secondary | ICD-10-CM

## 2018-11-22 LAB — POCT RAPID STREP A (OFFICE): Rapid Strep A Screen: NEGATIVE

## 2018-11-22 MED ORDER — METHYLPREDNISOLONE ACETATE 80 MG/ML IJ SUSP
80.0000 mg | Freq: Once | INTRAMUSCULAR | Status: AC
  Administered 2018-11-22: 80 mg via INTRAMUSCULAR

## 2018-11-22 NOTE — ED Provider Notes (Signed)
Luis Lynch C[facility]    CSN: [id number] Arrival date & time: 11/22/18  1621     History   Chief Complaint Chief Complaint  Patient presents with  . Sore Throat    HPI Luis Lynch is a 28 y.o. male.   Patient complains of sore throat, hoarseness, headache, myalgias, and fatigue for 3 days.  He vomited once today, now resolved.  He has a history of mild reactive airways disease and family history of asthma (brother).  The history is provided by the patient.    Past Medical History:  Diagnosis Date  . Anxiety   . Nasal turbinate hypertrophy   . PONV (postoperative nausea and vomiting)     Patient Active Problem List   Diagnosis Date Noted  . Influenza-like illness 10/07/2018  . Chronic rhinitis 09/20/2016  . Possible exposure to STD 12/27/2014  . Idiopathic scoliosis 12/14/2014  . Annual physical exam 09/12/2012  . Anxiety and depression 03/03/2010    Past Surgical History:  Procedure Laterality Date  . LEG SURGERY     left  . MANDIBLE SURGERY  2004  . NASAL SEPTOPLASTY W/ TURBINOPLASTY Bilateral 03/30/2014   Procedure: NASAL SEPTOPLASTY WITH BILATERAL TURBINATE REDUCTION;  Surgeon: Darletta Moll, MD;  Location: [facility];  Service: ENT;  Laterality: Bilateral;  . NOSE SURGERY    . WISDOM TOOTH EXTRACTION         Home Medications    Prior to Admission medications   Not on File    Family History Family History  Problem Relation Age of Onset  . Alcohol abuse Mother   . Hypertension Mother   . Heart disease Maternal Grandmother   . Cancer Maternal Grandmother        lung  . Cancer Maternal Grandfather        colon  . Hyperlipidemia Paternal Grandmother   . Hypertension Paternal Grandmother   . Diabetes Paternal Grandmother   . Cancer Paternal Grandfather        bladder  . Asthma Brother     Social History Social History   Tobacco Use  . Smoking status: Former Smoker    Packs/day: 1.00    Years: 2.00    Pack years:  2.00    Last attempt to quit: 03/17/2013    Years since quitting: 5.6  . Smokeless tobacco: Never Used  Substance Use Topics  . Alcohol use: Yes    Alcohol/week: 0.0 standard drinks    Comment: Drinks about one drink per couple months  . Drug use: No     Allergies   Flexeril [cyclobenzaprine]; Paxil [paroxetine hcl]; and Pollen extract   Review of Systems Review of Systems + sore throat + hoarse No cough No pleuritic pain No wheezing ? nasal congestion + post-nasal drainage No sinus pain/pressure No itchy/red eyes No earache No hemoptysis No SOB No fever/chills No nausea + vomiting No abdominal pain No diarrhea No urinary symptoms No skin rash + fatigue + myalgias + headache    Physical Exam Triage Vital Signs ED Triage Vitals  Enc Vitals Group     BP 11/22/18 1720 124/84     Pulse Rate 11/22/18 1720 69     Resp --      Temp 11/22/18 1720 (!) 97.5 F (36.4 C)     Temp Source 11/22/18 1720 Oral     SpO2 11/22/18 1720 97 %     Weight 11/22/18 1721 179 lb (81.2 kg)     Height 11/22/18  1721 6' (1.829 m)     Head Circumference --      Peak Flow --      Pain Score 11/22/18 1721 0     Pain Loc --      Pain Edu? --      Excl. in GC? --    No data found.  Updated Vital Signs BP 124/84 (BP Location: Right Arm)   Pulse 69   Temp (!) 97.5 F (36.4 C) (Oral)   Ht 6' (1.829 m)   Wt 81.2 kg   SpO2 97%   BMI 24.28 kg/m   Visual Acuity Right Eye Distance:   Left Eye Distance:   Bilateral Distance:    Right Eye Near:   Left Eye Near:    Bilateral Near:     Physical Exam Nursing notes and Vital Signs reviewed. Appearance:  Patient appears stated age, and in no acute distress Eyes:  Pupils are equal, round, and reactive to light and accomodation.  Extraocular movement is intact.  Conjunctivae are not inflamed  Ears:  Canals normal.  Tympanic membranes normal.  Nose:  Mildly congested turbinates.  No sinus tenderness.   Pharynx:  Minimal  erythema. Neck:  Supple.  Enlarged posterior/lateral nodes are palpated bilaterally, tender to palpation on the left.   Lungs:  Clear to auscultation.  Breath sounds are equal.  Moving air well. Heart:  Regular rate and rhythm without murmurs, rubs, or gallops.  Abdomen:  Nontender without masses or hepatosplenomegaly.  Bowel sounds are present.  No CVA or flank tenderness.  Extremities:  No edema.  Skin:  No rash present.    UC Treatments / Results  Labs (all labs ordered are listed, but only abnormal results are displayed) Labs Reviewed  POCT RAPID STREP A (OFFICE) negative    EKG None  Radiology No results found.  Procedures Procedures (including critical care time)  Medications Ordered in UC Medications  methylPREDNISolone acetate (DEPO-MEDROL) injection 80 mg (has no administration in time range)    Initial Impression / Assessment and Plan / UC Course  I have reviewed the triage vital signs and the nursing notes.  Pertinent labs & imaging results that were available during my care of the patient were reviewed by me and considered in my medical decision making (see chart for details).    Throat culture pending. There is no evidence of bacterial infection today.  Treat symptomatically for now  Because of his history of mild reactive airways disease, allergic rhinitis, and family history of asthma, will administer DepoMedrol 80mg  IM. Followup with Family Doctor if not improved in 7 to 10 days   Final Clinical Impressions(s) / UC Diagnoses   Final diagnoses:  Viral URI  Pharyngitis, unspecified etiology     Discharge Instructions     As cold-like symptoms develop, try the following: Take plain guaifenesin (1200mg  extended release tabs such as Mucinex) twice daily, with plenty of water, for cough and congestion.  May add Pseudoephedrine (30mg , one or two every 4 to 6 hours) for sinus congestion.  Get adequate rest.   May use Afrin nasal spray (or generic  oxymetazoline) each morning for about 5 days and then discontinue.  Also recommend using saline nasal spray several times daily and saline nasal irrigation (AYR is a common brand).  Use Flonase nasal spray each morning after using Afrin nasal spray and saline nasal irrigation. Try warm salt water gargles for sore throat.  Stop all antihistamines for now, and other non-prescription cough/cold preparations.  May take Delsym Cough Suppressant at bedtime for nighttime cough.     ED Prescriptions    None        Lattie HawBeese, Seleste Tallman A, MD 11/27/18 [phone]

## 2018-11-22 NOTE — ED Triage Notes (Addendum)
Pt c/o sore throat and fatigue x 3 days. Work as Educational psychologist. No fever. Also said he vomited today and has very little appetite. Pt requesting strep screen.

## 2018-11-22 NOTE — Discharge Instructions (Addendum)
As cold-like symptoms develop, try the following: Take plain guaifenesin (1200mg  extended release tabs such as Mucinex) twice daily, with plenty of water, for cough and congestion.  May add Pseudoephedrine (30mg , one or two every 4 to 6 hours) for sinus congestion.  Get adequate rest.   May use Afrin nasal spray (or generic oxymetazoline) each morning for about 5 days and then discontinue.  Also recommend using saline nasal spray several times daily and saline nasal irrigation (AYR is a common brand).  Use Flonase nasal spray each morning after using Afrin nasal spray and saline nasal irrigation. Try warm salt water gargles for sore throat.  Stop all antihistamines for now, and other non-prescription cough/cold preparations. May take Delsym Cough Suppressant at bedtime for nighttime cough.

## 2018-11-24 ENCOUNTER — Telehealth: Payer: Self-pay | Admitting: Emergency Medicine

## 2018-11-24 LAB — STREP A DNA PROBE: Group A Strep Probe: NOT DETECTED

## 2018-12-05 ENCOUNTER — Ambulatory Visit: Payer: BLUE CROSS/BLUE SHIELD | Admitting: Sports Medicine

## 2019-05-27 ENCOUNTER — Telehealth: Payer: Self-pay | Admitting: Sports Medicine

## 2019-05-27 DIAGNOSIS — Z111 Encounter for screening for respiratory tuberculosis: Secondary | ICD-10-CM

## 2019-05-27 NOTE — Addendum Note (Signed)
Addended by: Towana Badger on: 05/27/2019 11:14 AM   Modules accepted: Orders

## 2019-05-27 NOTE — Telephone Encounter (Signed)
Patient states that he needs a tb quantiferon gold test. Would like this within a week. 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 Not sure if an order is needed. Please advise.

## 2019-05-27 NOTE — Telephone Encounter (Signed)
Pended, please sign if ok

## 2019-05-27 NOTE — Addendum Note (Signed)
Addended by: Silverio Decamp on: 05/27/2019 12:13 PM   Modules accepted: Orders

## 2019-05-27 NOTE — Telephone Encounter (Signed)
Absolutely fine, thanks for placing the order for me.

## 2019-05-28 NOTE — Telephone Encounter (Signed)
Pt advised.

## 2019-05-29 ENCOUNTER — Other Ambulatory Visit: Payer: Self-pay

## 2019-05-29 ENCOUNTER — Encounter: Payer: Self-pay | Admitting: Sports Medicine

## 2019-05-29 ENCOUNTER — Ambulatory Visit (INDEPENDENT_AMBULATORY_CARE_PROVIDER_SITE_OTHER): Payer: BC Managed Care – PPO | Admitting: Sports Medicine

## 2019-05-29 ENCOUNTER — Ambulatory Visit (INDEPENDENT_AMBULATORY_CARE_PROVIDER_SITE_OTHER): Payer: BC Managed Care – PPO

## 2019-05-29 DIAGNOSIS — M898X1 Other specified disorders of bone, shoulder: Secondary | ICD-10-CM

## 2019-05-29 DIAGNOSIS — M47812 Spondylosis without myelopathy or radiculopathy, cervical region: Secondary | ICD-10-CM | POA: Insufficient documentation

## 2019-05-29 DIAGNOSIS — M546 Pain in thoracic spine: Secondary | ICD-10-CM | POA: Diagnosis not present

## 2019-05-29 DIAGNOSIS — M542 Cervicalgia: Secondary | ICD-10-CM | POA: Diagnosis not present

## 2019-05-29 MED ORDER — PREDNISONE 50 MG PO TABS: ORAL_TABLET | ORAL | 0 refills | Status: DC

## 2019-05-29 MED ORDER — CYCLOBENZAPRINE HCL 10 MG PO TABS: ORAL_TABLET | ORAL | 0 refills | Status: DC

## 2019-05-29 NOTE — Assessment & Plan Note (Signed)
Right-sided periscapular pain for about a month now after lifting a heavy patient. Adding 5 days of prednisone, Flexeril at night, x-rays of the cervical thoracic spine. Cervical spondylosis rehab exercises as well as rhomboid rehab given. Return to see me in 3 to 4 weeks.

## 2019-05-29 NOTE — Progress Notes (Signed)
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  Unfortunately symptoms have persisted in spite of stretching, position and activity modification.  Moderate, persistent, localized without radiation, no paresthesias, no neck pain, it is not positional.  I reviewed the past medical history, family history, social history, surgical history, and allergies today and no changes were needed.  Please see the problem list section below in epic for further details.  Past Medical History: Past Medical History:  Diagnosis Date  . Anxiety   . Nasal turbinate hypertrophy   . PONV (postoperative nausea and vomiting)    Past Surgical History: Past Surgical History:  Procedure Laterality Date  . LEG SURGERY     left  . MANDIBLE SURGERY  2004  . NASAL SEPTOPLASTY W/ TURBINOPLASTY Bilateral 03/30/2014   Procedure: NASAL SEPTOPLASTY WITH BILATERAL TURBINATE REDUCTION;  Surgeon: Ascencion Dike, MD;  Location: [place];  Service: ENT;  Laterality: Bilateral;  . NOSE SURGERY    . WISDOM TOOTH EXTRACTION     Social History: Social History   Socioeconomic History  . Marital status: Single    Spouse name: Not on file  . Number of children: Not on file  . Years of education: Not on file  . Highest education level: Not on file  Occupational History  . Not on file  Social Needs  . Financial resource strain: Not on file  . Food insecurity    Worry: Not on file    Inability: Not on file  . Transportation needs    Medical: Not on file    Non-medical: Not on file  Tobacco Use  . Smoking status: Former Smoker    Packs/day: 1.00    Years: 2.00    Pack years: 2.00    Quit date: 03/17/2013    Years since quitting: 6.2  . Smokeless tobacco: Never Used  Substance and Sexual Activity  . Alcohol use: Yes     Alcohol/week: 0.0 standard drinks    Comment: Drinks about one drink per couple months  . Drug use: No  . Sexual activity: Never  Lifestyle  . Physical activity    Days per week: Not on file    Minutes per session: Not on file  . Stress: Not on file  Relationships  . Social Herbalist on phone: Not on file    Gets together: Not on file    Attends religious service: Not on file    Active member of club or organization: Not on file    Attends meetings of clubs or organizations: Not on file    Relationship status: Not on file  Other Topics Concern  . Not on file  Social History Narrative  . Not on file   Family History: Family History  Problem Relation Age of Onset  . Alcohol abuse Mother   . Hypertension Mother   . Heart disease Maternal Grandmother   . Cancer Maternal Grandmother        lung  . Cancer Maternal Grandfather        colon  . Hyperlipidemia Paternal Grandmother   . Hypertension Paternal Grandmother   . Diabetes Paternal Grandmother   . Cancer Paternal Grandfather        bladder  . Asthma Brother  Allergies: Allergies  Allergen Reactions  . Paxil [Paroxetine Hcl]   . Pollen Extract    Medications: See med rec.  Review of Systems: No fevers, chills, night sweats, weight loss, chest pain, or shortness of breath.   Objective:    General: Well Developed, well nourished, and in no acute distress.  Neuro: Alert and oriented x3, extra-ocular muscles intact, sensation grossly intact.  HEENT: Normocephalic, atraumatic, pupils equal round reactive to light, neck supple, no masses, no lymphadenopathy, thyroid nonpalpable.  Skin: Warm and dry, no rashes. Cardiac: Regular rate and rhythm, no murmurs rubs or gallops, no lower extremity edema.  Respiratory: Clear to auscultation bilaterally. Not using accessory muscles, speaking in full sentences. Neck: Negative spurling's Full neck range of motion Grip strength and sensation normal in bilateral  hands Strength good C4 to T1 distribution No sensory change to C4 to T1 Reflexes normal Right shoulder: Inspection reveals no abnormalities, atrophy or asymmetry. Palpation is normal with no tenderness over AC joint or bicipital groove. ROM is full in all planes. Rotator cuff strength normal throughout. No signs of impingement with negative Neer and Hawkin's tests, empty can. Speeds and Yergason's tests normal. No labral pathology noted with negative Obrien's, negative crank, negative clunk, and good stability. Normal scapular function observed. No painful arc and no drop arm sign. No apprehension sign  Impression and Recommendations:    Periscapular pain Right-sided periscapular pain for about a month now after lifting a heavy patient. Adding 5 days of prednisone, Flexeril at night, x-rays of the cervical thoracic spine. Cervical spondylosis rehab exercises as well as rhomboid rehab given. Return to see me in 3 to 4 weeks.   ___________________________________________ [name], M.D., ABFM., CAQSM. Primary Care and Sports Medicine [facility]  Adjunct Professor of Family Medicine  [employer]

## 2020-03-30 ENCOUNTER — Encounter: Payer: Self-pay | Admitting: Family Medicine

## 2020-03-30 ENCOUNTER — Ambulatory Visit (INDEPENDENT_AMBULATORY_CARE_PROVIDER_SITE_OTHER): Payer: BC Managed Care – PPO | Admitting: Family Medicine

## 2020-03-30 ENCOUNTER — Telehealth: Payer: Self-pay

## 2020-03-30 DIAGNOSIS — R42 Dizziness and giddiness: Secondary | ICD-10-CM | POA: Diagnosis not present

## 2020-03-30 NOTE — Assessment & Plan Note (Signed)
Likely related to barotrauma from jumping into quarry.   Discussed this will likely improve on its own.  He can try meclizine as needed.  He is concerned about possible brain eating amoeba however he has not changes to smell or taste, fever, or headaches.  Discussed with him that this is extremely rare and unlikely but to contact us if having new or worsening symptoms.

## 2020-03-30 NOTE — Telephone Encounter (Signed)
Encounter opened in error

## 2020-03-30 NOTE — Progress Notes (Signed)
Luis Lynch - 29 y.o. male MRN [id number]  Date of birth: 1990/11/07  Subjective Chief Complaint  Patient presents with  . Dizziness    HPI Luis Lynch is 29 y.o. male here today with complaint of dizziness.  Dizziness is worse with moving head quickly.  Reports jumping from a height of about 20-30 feet into [place] water filled [place] over the weekend.  Throbbing sensation in ear that evening and dizziness starting yesterday.   He is still able to hear but this is slightly diminished.  He denies drainage or bleeding from the ear.  He denies fever, chills, headache, nausea, tinnitus,  nasal congestion.    ROS:  A comprehensive ROS was completed and negative except as noted per HPI  Allergies  Allergen Reactions  . Bee Pollen Other (See Comments)  . Paxil [Paroxetine Hcl]   . Pollen Extract     Past Medical History:  Diagnosis Date  . Anxiety   . Nasal turbinate hypertrophy   . PONV (postoperative nausea and vomiting)     Past Surgical History:  Procedure Laterality Date  . LEG SURGERY     left  . MANDIBLE SURGERY  2004  . NASAL SEPTOPLASTY W/ TURBINOPLASTY Bilateral 03/30/2014   Procedure: NASAL SEPTOPLASTY WITH BILATERAL TURBINATE REDUCTION;  Surgeon: Ascencion Dike, MD;  Location: [place];  Service: ENT;  Laterality: Bilateral;  . NOSE SURGERY    . WISDOM TOOTH EXTRACTION      Social History   Socioeconomic History  . Marital status: Single    Spouse name: Not on file  . Number of children: Not on file  . Years of education: Not on file  . Highest education level: Not on file  Occupational History  . Not on file  Tobacco Use  . Smoking status: Former Smoker    Packs/day: 1.00    Years: 2.00    Pack years: 2.00    Quit date: 03/17/2013    Years since quitting: 7.0  . Smokeless tobacco: Never Used  Substance and Sexual Activity  . Alcohol use: Yes    Alcohol/week: 0.0 standard drinks    Comment: Drinks about one drink per couple  months  . Drug use: No  . Sexual activity: Never  Other Topics Concern  . Not on file  Social History Narrative  . Not on file   Social Determinants of Health   Financial Resource Strain:   . Difficulty of Paying Living Expenses:   Food Insecurity:   . Worried About Charity fundraiser in the Last Year:   . Arboriculturist in the Last Year:   Transportation Needs:   . Film/video editor (Medical):   Marland Kitchen Lack of Transportation (Non-Medical):   Physical Activity:   . Days of Exercise per Week:   . Minutes of Exercise per Session:   Stress:   . Feeling of Stress :   Social Connections:   . Frequency of Communication with Friends and Family:   . Frequency of Social Gatherings with Friends and Family:   . Attends Religious Services:   . Active Member of Clubs or Organizations:   . Attends Archivist Meetings:   Marland Kitchen Marital Status:     Family History  Problem Relation Age of Onset  . Alcohol abuse Mother   . Hypertension Mother   . Heart disease Maternal Grandmother   . Cancer Maternal Grandmother        lung  .  Cancer Maternal Grandfather        colon  . Hyperlipidemia Paternal Grandmother   . Hypertension Paternal Grandmother   . Diabetes Paternal Grandmother   . Cancer Paternal Grandfather        bladder  . Asthma Brother     Health Maintenance  Topic Date Due  . TETANUS/TDAP  09/22/2022  . COVID-19 Vaccine  Completed  . HIV Screening  Completed     ----------------------------------------------------------------------------------------------------------------------------------------------------------------------------------------------------------------- Physical Exam BP (!) 144/83 (BP Location: Left Arm, Patient Position: Sitting, Cuff Size: Large)   Pulse 80   Temp (!) 97.4 F (36.3 C)   Ht 6' 0.05" (1.83 m)   Wt 176 lb 11.2 oz (80.2 kg)   SpO2 99%   BMI 23.93 kg/m   Physical Exam Constitutional:      Appearance: Normal appearance.   HENT:     Head: Normocephalic and atraumatic.     Right Ear: Tympanic membrane normal.     Ears:     Comments: Small serous effusion L TM.      Mouth/Throat:     Mouth: Mucous membranes are moist.  Eyes:     General: No scleral icterus. Cardiovascular:     Rate and Rhythm: Normal rate and regular rhythm.  Pulmonary:     Effort: Pulmonary effort is normal.     Breath sounds: Normal breath sounds.  Musculoskeletal:     Cervical back: Neck supple.  Skin:    General: Skin is warm and dry.  Neurological:     General: No focal deficit present.     Mental Status: He is alert and oriented to person, place, and time.     Gait: Gait normal.  Psychiatric:        Mood and Affect: Mood normal.        Behavior: Behavior normal.     ------------------------------------------------------------------------------------------------------------------------------------------------------------------------------------------------------------------- Assessment and Plan  Vertigo Likely related to barotrauma from jumping into quarry.   Discussed this will likely improve on its own.  He can try meclizine as needed.  He is concerned about possible brain eating amoeba however he has not changes to smell or taste, fever, or headaches.  Discussed with him that this is extremely rare and unlikely but to contact us if having new or worsening symptoms.     No orders of the defined types were placed in this encounter.   No follow-ups on file.    This visit occurred during the SARS-CoV-2 public health emergency.  Safety protocols were in place, including screening questions prior to the visit, additional usage of staff PPE, and extensive cleaning of exam room while observing appropriate contact time as indicated for disinfecting solutions.

## 2020-03-30 NOTE — Patient Instructions (Signed)
You can try meclizine for dizziness If not improving or developing new/worsening symptoms let us know.    Ear Barotrauma, Adult  Ear barotrauma is inflammation of the middle ear. This condition occurs when an auditory tube (eustachian tube) is blocked in one or both ears. The eustachian tubes lead from the middle ear to the back of the nose (nasopharynx). This condition typically occurs when you experience changes in air pressure, such as when flying or scuba diving. Untreated ear barotrauma may lead to ear damage or hearing loss, which can become permanent. What are the causes? The pressure difference that causes ear barotrauma can result from various things, including:  Flying in an airplane.  Descending or coming to the surface too quickly when scuba diving.  Going to higher altitudes quickly.  Being too close to an explosion or blast. What increases the risk? The following factors may make you more likely to develop this condition:  A middle ear infection, sinus infection, or a cold.  Environmental allergies.  Small eustachian tubes.  Recent ear surgery. What are the signs or symptoms? Symptoms of this condition include:  Ear pain.  Sudden partial or complete loss of hearing.  Feeling that your ear is clogged.  Dizziness that creates a spinning, rocking, or tumbling sensation (vertigo).  Loss of balance.  Nausea.  Ringing in your ears.  Bleeding from your ears.  Headache.  Facial pain. How is this diagnosed? This condition may be diagnosed based on:  A physical exam. Your health care provider may: ? Use a device (otoscope) to look into your ear canal and check the eardrum. ? Do a test that changes air pressure in the middle ear (tympanogram). This test checks how well the eardrum and eustachian tube are working.  Your medical history and your symptoms.  A hearing test. You may be referred to a health care provider who specializes in treating ear  conditions (otolaryngologist, or "ENT"). How is this treated? This condition may be treated with:  Medicines to relieve stuffiness (congestion) in your nose, sinus, or upper respiratory tract (decongestants).  Techniques to "pop" your ears (equalize pressure), such as: ? Yawning. ? Chewing gum. ? Swallowing. ? Holding your nose closed and gently blowing.  Surgery to relieve symptoms or prevent future inflammation. This is done in severe cases. Follow these instructions at home:  Take over-the-counter and prescription medicines only as told by your health care provider.  Use techniques to equalize pressure as told by your health care provider.  Do not put anything into your ears to clean or unplug them. Ear drops will not help.  Do not do any of the following until your health care provider approves: ? Travel to high altitudes. ? Fly in an airplane. ? Scuba dive.  Keep all follow-up visits as told by your health care provider. This is important. How is this prevented? Using these strategies may help to prevent ear barotrauma:  Avoid exposure to pressure changes when you have symptoms of a cold or congestion.  If you have congestion and will be flying, use a nasal decongestant about 30-60 minutes before flying.  When flying, chew gum and swallow frequently and forcefully during takeoff and landing.  Hold your nose closed and gently blow to "pop" your ears. This forces air into the eustachian tubes and equalizes pressure.  Yawn during air pressure changes.  If scuba diving, dive feet first and equalize pressure often as you go deeper in the water. Contact a health care provider  if:  Your symptoms get worse or do not get better.  You have vertigo.  You have hearing loss.  You have a fever. Get help right away if:  You have severe ear pain.  You have a severe headache.  You have severe dizziness.  You have bloody or pus-like drainage from your ears.  You have  balance problems.  You cannot move or feel part of your face. Summary  Ear barotrauma is inflammation of the middle ear.  This condition typically occurs when you experience changes in pressure, such as when flying or scuba diving.  You may be at a higher risk for this condition if you have small eustachian tubes, had recent ear surgery, or have allergies, a cold, or a sinus or middle ear infection.  This condition may be treated with medicines or techniques to "pop" your ears (equalize pressure).  Strategies can be used to help prevent ear barotrauma. This information is not intended to replace advice given to you by your health care provider. Make sure you discuss any questions you have with your health care provider. Document Revised: 10/04/2017 Document Reviewed: 02/01/2017 Elsevier Patient Education  2020 [employer].

## 2020-04-08 ENCOUNTER — Other Ambulatory Visit: Payer: Self-pay

## 2020-04-08 ENCOUNTER — Encounter: Payer: Self-pay | Admitting: Sports Medicine

## 2020-04-08 ENCOUNTER — Ambulatory Visit (INDEPENDENT_AMBULATORY_CARE_PROVIDER_SITE_OTHER): Payer: BC Managed Care – PPO | Admitting: Sports Medicine

## 2020-04-08 DIAGNOSIS — F419 Anxiety disorder, unspecified: Secondary | ICD-10-CM

## 2020-04-08 DIAGNOSIS — F329 Major depressive disorder, single episode, unspecified: Secondary | ICD-10-CM

## 2020-04-08 DIAGNOSIS — F32A Depression, unspecified: Secondary | ICD-10-CM

## 2020-04-08 MED ORDER — ATOMOXETINE HCL 18 MG PO CAPS: 18.0000 mg | ORAL_CAPSULE | Freq: Every day | ORAL | 3 refills | Status: DC

## 2020-04-08 MED ORDER — DULOXETINE HCL 30 MG PO CPEP: 30.0000 mg | ORAL_CAPSULE | Freq: Every day | ORAL | 3 refills | Status: DC

## 2020-04-08 MED ORDER — ALPRAZOLAM 0.5 MG PO TABS: 0.5000 mg | ORAL_TABLET | Freq: Every day | ORAL | 0 refills | Status: DC | PRN

## 2020-04-08 NOTE — Progress Notes (Addendum)
    Procedures performed today:    None.  Independent interpretation of notes and tests performed by another provider:   None.  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 We discussed his concurrent anxiety and depressive symptoms. I am going to start low-dose Cymbalta, and adding some low-dose alprazolam as he is having some panic attacks particularly at night. Return to see me in a Doximity visit in 4 weeks to reevaluate with PHQ and GAD.    ___________________________________________ [name]. [name], M.D., ABFM., CAQSM. Primary Care and Sports Medicine  [facility]  Adjunct Instructor of Family Medicine  [employer]

## 2020-04-08 NOTE — Addendum Note (Signed)
Addended by: Monica Becton on: 04/08/2020 10:42 AM   Modules accepted: Orders

## 2020-04-08 NOTE — Assessment & Plan Note (Addendum)
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 We discussed his concurrent anxiety and depressive symptoms. I am going to start low-dose Cymbalta, and adding some low-dose alprazolam as he is having some panic attacks particularly at night. Return to see me in a Doximity visit in 4 weeks to reevaluate with PHQ and GAD.

## 2020-05-06 ENCOUNTER — Ambulatory Visit: Payer: BC Managed Care – PPO | Admitting: Sports Medicine

## 2020-05-24 ENCOUNTER — Other Ambulatory Visit: Payer: Self-pay

## 2020-05-24 DIAGNOSIS — F32A Depression, unspecified: Secondary | ICD-10-CM

## 2020-05-24 DIAGNOSIS — F419 Anxiety disorder, unspecified: Secondary | ICD-10-CM

## 2020-05-24 MED ORDER — ALPRAZOLAM 0.5 MG PO TABS: 0.5000 mg | ORAL_TABLET | Freq: Every day | ORAL | 0 refills | Status: DC | PRN

## 2020-06-04 ENCOUNTER — Other Ambulatory Visit: Payer: Self-pay

## 2020-06-04 DIAGNOSIS — F32A Depression, unspecified: Secondary | ICD-10-CM

## 2020-06-04 DIAGNOSIS — F419 Anxiety disorder, unspecified: Secondary | ICD-10-CM

## 2020-06-06 MED ORDER — ALPRAZOLAM 0.5 MG PO TABS: 0.5000 mg | ORAL_TABLET | Freq: Every day | ORAL | 0 refills | Status: DC | PRN

## 2020-07-01 ENCOUNTER — Ambulatory Visit: Payer: BC Managed Care – PPO | Admitting: Nurse Practitioner

## 2020-07-20 ENCOUNTER — Telehealth (INDEPENDENT_AMBULATORY_CARE_PROVIDER_SITE_OTHER): Payer: Managed Care, Other (non HMO) | Admitting: Sports Medicine

## 2020-07-20 DIAGNOSIS — F329 Major depressive disorder, single episode, unspecified: Secondary | ICD-10-CM

## 2020-07-20 DIAGNOSIS — F419 Anxiety disorder, unspecified: Secondary | ICD-10-CM | POA: Diagnosis not present

## 2020-07-20 DIAGNOSIS — F32A Depression, unspecified: Secondary | ICD-10-CM

## 2020-07-20 MED ORDER — ESCITALOPRAM OXALATE 10 MG PO TABS: ORAL_TABLET | ORAL | 3 refills | Status: DC

## 2020-07-20 MED ORDER — ALPRAZOLAM 0.5 MG PO TABS: 0.5000 mg | ORAL_TABLET | Freq: Every day | ORAL | 0 refills | Status: DC | PRN

## 2020-07-20 NOTE — Progress Notes (Signed)
   Virtual Visit via WebEx/MyChart   I connected with  Kyi Romanello Pilar  on 07/20/20 via WebEx/MyChart/Doximity Video and verified that I am speaking with the correct person using two identifiers.   I discussed the limitations, risks, security and privacy concerns of performing an evaluation and management service by WebEx/MyChart/Doximity Video, including the higher likelihood of inaccurate diagnosis and treatment, and the availability of in person appointments.  We also discussed the likely need of an additional face to face encounter for complete and high quality delivery of care.  I also discussed with the patient that there may be a patient responsible charge related to this service. The patient expressed understanding and wishes to proceed.  Provider location is in medical facility. Patient location is at their home, different from provider location. People involved in care of the patient during this telehealth encounter were myself, my nurse/medical assistant, and my front office/scheduling team member.  Review of Systems: No fevers, chills, night sweats, weight loss, chest pain, or shortness of breath.   Objective Findings:    General: Speaking full sentences, no audible heavy breathing.  Sounds alert and appropriately interactive.  Appears well.  Face symmetric.  Extraocular movements intact.  Pupils equal and round.  No nasal flaring or accessory muscle use visualized.  Independent interpretation of tests performed by another provider:   None.  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 Cymbalta was effective, it improved his PHQ and gad score significantly, alprazolam is also been fairly helpful. He is having some GI upset with the Cymbalta so we are going to switch to Lexapro in an up taper  from 5 to 10 mg with a 6-week follow-up for repeat PHQ and GAD, virtual visit is okay.   I discussed the above assessment and treatment plan with the patient. The patient was provided an opportunity to ask questions and all were answered. The patient agreed with the plan and demonstrated an understanding of the instructions.   The patient was advised to call back or seek an in-person evaluation if the symptoms worsen or if the condition fails to improve as anticipated.   I provided 30 minutes of face to face and non-face-to-face time during this encounter date, time was needed to gather information, review chart, records, communicate/coordinate with staff remotely, as well as complete documentation.   ___________________________________________ [name]. [name], M.D., ABFM., CAQSM. Primary Care and Sports Medicine [place] [facility]  Adjunct Instructor of Family Medicine  [employer]

## 2020-07-20 NOTE — Assessment & Plan Note (Signed)
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 Cymbalta was effective, it improved his PHQ and gad score significantly, alprazolam is also been fairly helpful. He is having some GI upset with the Cymbalta so we are going to switch to Lexapro in an up taper from 5 to 10 mg with a 6-week follow-up for repeat PHQ and GAD, virtual visit is okay.

## 2021-02-14 ENCOUNTER — Telehealth (INDEPENDENT_AMBULATORY_CARE_PROVIDER_SITE_OTHER): Payer: Managed Care, Other (non HMO) | Admitting: Sports Medicine

## 2021-02-14 ENCOUNTER — Other Ambulatory Visit: Payer: Self-pay

## 2021-02-14 DIAGNOSIS — G43709 Chronic migraine without aura, not intractable, without status migrainosus: Secondary | ICD-10-CM

## 2021-02-14 DIAGNOSIS — F32A Depression, unspecified: Secondary | ICD-10-CM | POA: Diagnosis not present

## 2021-02-14 DIAGNOSIS — F419 Anxiety disorder, unspecified: Secondary | ICD-10-CM

## 2021-02-14 DIAGNOSIS — G43909 Migraine, unspecified, not intractable, without status migrainosus: Secondary | ICD-10-CM | POA: Insufficient documentation

## 2021-02-14 MED ORDER — ALPRAZOLAM 0.5 MG PO TABS: 0.5000 mg | ORAL_TABLET | Freq: Every day | ORAL | 0 refills | Status: DC | PRN

## 2021-02-14 MED ORDER — ESCITALOPRAM OXALATE 10 MG PO TABS: ORAL_TABLET | ORAL | 3 refills | Status: DC

## 2021-02-14 NOTE — Assessment & Plan Note (Signed)
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 Generally he is able to control these eventually with Tylenol and caffeine. He does not desire any other migraine abortifacients. He did miss his shift today due to the headache and is requesting a work note, this will be written. He can contact me if he has any other questions, his migraines are simple without any focal neurologic or constitutional symptoms.

## 2021-02-14 NOTE — Progress Notes (Signed)
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 He states he felt better after vomiting.

## 2021-02-14 NOTE — Progress Notes (Signed)
   Virtual Visit via WebEx/MyChart   I connected with  Luis Lynch  on 02/14/21 via WebEx/MyChart/Doximity Video and verified that I am speaking with the correct person using two identifiers.   I discussed the limitations, risks, security and privacy concerns of performing an evaluation and management service by WebEx/MyChart/Doximity Video, including the higher likelihood of inaccurate diagnosis and treatment, and the availability of in person appointments.  We also discussed the likely need of an additional face to face encounter for complete and high quality delivery of care.  I also discussed with the patient that there may be a patient responsible charge related to this service. The patient expressed understanding and wishes to proceed.  Provider location is in medical facility. Patient location is at their home, different from provider location. People involved in care of the patient during this telehealth encounter were myself, my nurse/medical assistant, and my front office/scheduling team member.  Review of Systems: No fevers, chills, night sweats, weight loss, chest pain, or shortness of breath.   Objective Findings:    General: Speaking full sentences, no audible heavy breathing.  Sounds alert and appropriately interactive.  Appears well.  Face symmetric.  Extraocular movements intact.  Pupils equal and round.  No nasal flaring or accessory muscle use visualized.  Independent interpretation of tests performed by another provider:   None.  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 Generally he is able to control these eventually with Tylenol and caffeine. He does not desire any other migraine abortifacients. He did miss his shift today due to the headache and is requesting a work note, this will be  written. He can contact me if he has any other questions, his migraines are simple without any focal neurologic or constitutional symptoms.   I discussed the above assessment and treatment plan with the patient. The patient was provided an opportunity to ask questions and all were answered. The patient agreed with the plan and demonstrated an understanding of the instructions.   The patient was advised to call back or seek an in-person evaluation if the symptoms worsen or if the condition fails to improve as anticipated.   I provided 30 minutes of face to face and non-face-to-face time during this encounter date, time was needed to gather information, review chart, records, communicate/coordinate with staff remotely, as well as complete documentation.   ___________________________________________ [name]. [name], M.D., ABFM., CAQSM. Primary Care and Sports Medicine [place] [facility]  Adjunct Instructor of Family Medicine  [employer]

## 2021-05-05 ENCOUNTER — Ambulatory Visit: Payer: Managed Care, Other (non HMO) | Admitting: Sports Medicine

## 2021-05-05 ENCOUNTER — Other Ambulatory Visit: Payer: Self-pay

## 2021-05-05 ENCOUNTER — Encounter: Payer: Self-pay | Admitting: Sports Medicine

## 2021-05-05 VITALS — BP 122/79 | HR 67 | Ht 72.0 in | Wt 190.0 lb

## 2021-05-05 DIAGNOSIS — M6281 Muscle weakness (generalized): Secondary | ICD-10-CM | POA: Diagnosis not present

## 2021-05-05 DIAGNOSIS — A09 Infectious gastroenteritis and colitis, unspecified: Secondary | ICD-10-CM | POA: Diagnosis not present

## 2021-05-05 DIAGNOSIS — U071 COVID-19: Secondary | ICD-10-CM | POA: Diagnosis not present

## 2021-05-05 MED ORDER — METHYLPREDNISOLONE 4 MG PO TBPK: ORAL_TABLET | ORAL | 0 refills | Status: DC

## 2021-05-05 NOTE — Assessment & Plan Note (Addendum)
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 He does recall an upper respiratory and diarrheal illness approximately a week ago just prior to the onset of his symptoms. He did test negative for COVID. His distal lower extremity weakness is resolving, he still has some mid to upper thigh weakness.  No upper extremity weakness, no shortness of breath. On exam he has good strength throughout with maybe trace weakness to dorsiflexion of both legs, no paresthesias. I think this is likely a postviral myositis type picture, we will get CBC, CMP, CK levels, I would also like his COVID IgG and IgM levels, as well as stool studies considering his diarrheal illness. Adding a Medrol Dosepak, he declines prednisone. Return to see me in 2 weeks. If persistent symptoms we will consider nerve conduction and electromyography.  Update 05/09/2021: In spite of negative antigen swab, COVID IgM is positive, IgG is negative suggesting recent illness, this suggest that his recent thigh and calf weakness, tightness and upper respiratory and diarrheal symptoms are related to COVID. He needs to isolate for 5 days and then wear a tight fitting mask for the next 5 days, he is out of the window for Paxlovid.

## 2021-05-05 NOTE — Progress Notes (Addendum)
    Procedures performed today:    None.  Independent interpretation of notes and tests performed by another provider:   None.  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 He does recall an upper respiratory and diarrheal illness approximately a week ago just prior to the onset of his symptoms. He did test negative for COVID. His distal lower extremity weakness is resolving, he still has some mid to upper thigh weakness.  No upper extremity weakness, no shortness of breath. On exam he has good strength throughout with maybe trace weakness to dorsiflexion of both legs, no paresthesias. I think this is likely a postviral myositis type picture, we will get CBC, CMP, CK levels, I would also like his COVID IgG and IgM levels, as well as stool studies considering his diarrheal illness. Adding a Medrol Dosepak, he declines prednisone. Return to see me in 2 weeks. If persistent symptoms we will consider nerve conduction and electromyography.  Update 05/09/2021: In spite of negative antigen swab, COVID IgM is positive, IgG is negative suggesting recent illness, this suggest that his recent thigh and calf weakness, tightness and upper respiratory and diarrheal symptoms are related to COVID. He needs to isolate for 5 days and then wear a tight fitting mask for the next 5 days, he is out of the window for Paxlovid.    ___________________________________________ Luis Lynch. Luis Lynch, M.D., ABFM., CAQSM. Primary Care and Sports Medicine [place] [facility]  Adjunct Instructor of Family Medicine  [employer]

## 2021-05-05 NOTE — Addendum Note (Signed)
Addended by: Monica Becton on: 05/05/2021 02:35 PM   Modules accepted: Orders

## 2021-05-09 LAB — COMPREHENSIVE METABOLIC PANEL
AG Ratio: 1.8 (calc) (ref 1.0–2.5)
ALT: 26 U/L (ref 9–46)
AST: 21 U/L (ref 10–40)
Albumin: 4.6 g/dL (ref 3.6–5.1)
Alkaline phosphatase (APISO): 69 U/L (ref 36–130)
BUN: 16 mg/dL (ref 7–25)
CO2: 24 mmol/L (ref 20–32)
Calcium: 9.4 mg/dL (ref 8.6–10.3)
Chloride: 106 mmol/L (ref 98–110)
Creat: 0.72 mg/dL (ref 0.60–1.35)
Globulin: 2.5 g/dL (calc) (ref 1.9–3.7)
Glucose, Bld: 82 mg/dL (ref 65–99)
Potassium: 4.2 mmol/L (ref 3.5–5.3)
Sodium: 139 mmol/L (ref 135–146)
Total Bilirubin: 0.4 mg/dL (ref 0.2–1.2)
Total Protein: 7.1 g/dL (ref 6.1–8.1)

## 2021-05-09 LAB — SARS COV-2 SEROLOGY(COVID-19)AB(IGG,IGM),IMMUNOASSAY
SARS CoV-2 AB IgG: NEGATIVE
SARS CoV-2 IgM: POSITIVE — AB

## 2021-05-09 LAB — CBC
HCT: 47 % (ref 38.5–50.0)
Hemoglobin: 16.2 g/dL (ref 13.2–17.1)
MCH: 30.6 pg (ref 27.0–33.0)
MCHC: 34.5 g/dL (ref 32.0–36.0)
MCV: 88.7 fL (ref 80.0–100.0)
MPV: 12.3 fL (ref 7.5–12.5)
Platelets: 261 10*3/uL (ref 140–400)
RBC: 5.3 10*6/uL (ref 4.20–5.80)
RDW: 12.9 % (ref 11.0–15.0)
WBC: 7 10*3/uL (ref 3.8–10.8)

## 2021-05-09 LAB — CK: Total CK: 182 U/L (ref 44–196)

## 2021-05-11 ENCOUNTER — Other Ambulatory Visit: Payer: Self-pay

## 2021-05-11 DIAGNOSIS — M6281 Muscle weakness (generalized): Secondary | ICD-10-CM

## 2021-05-12 ENCOUNTER — Other Ambulatory Visit: Payer: Self-pay

## 2021-05-12 DIAGNOSIS — F32A Depression, unspecified: Secondary | ICD-10-CM

## 2021-05-12 MED ORDER — ALPRAZOLAM 0.5 MG PO TABS: 0.5000 mg | ORAL_TABLET | Freq: Every day | ORAL | 0 refills | Status: DC | PRN

## 2021-05-23 ENCOUNTER — Ambulatory Visit: Payer: Managed Care, Other (non HMO) | Admitting: Sports Medicine

## 2021-06-02 ENCOUNTER — Ambulatory Visit: Payer: Managed Care, Other (non HMO) | Admitting: Sports Medicine

## 2021-06-02 ENCOUNTER — Other Ambulatory Visit: Payer: Self-pay

## 2021-06-02 ENCOUNTER — Encounter: Payer: Self-pay | Admitting: Sports Medicine

## 2021-06-02 DIAGNOSIS — U071 COVID-19: Secondary | ICD-10-CM | POA: Diagnosis not present

## 2021-06-02 MED ORDER — CYCLOBENZAPRINE HCL 10 MG PO TABS: ORAL_TABLET | ORAL | 0 refills | Status: DC

## 2021-06-02 NOTE — Assessment & Plan Note (Signed)
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 We added a single Medrol Dosepak. He improved considerably, has a bit of tightness in his right thigh, asking for Flexeril, I think this is fine. Otherwise symptoms have resolved and he can resume work as previously.

## 2021-06-02 NOTE — Progress Notes (Signed)
    Procedures performed today:    None.  Independent interpretation of notes and tests performed by another provider:   None.  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 We added a single Medrol Dosepak. He improved considerably, has a bit of tightness in his right thigh, asking for Flexeril, I think this is fine. Otherwise symptoms have resolved and he can resume work as previously.    ___________________________________________ [name]. [name], M.D., ABFM., CAQSM. Primary Care and Sports Medicine  [facility]  Adjunct Instructor of Family Medicine  [employer]

## 2021-07-06 ENCOUNTER — Telehealth (INDEPENDENT_AMBULATORY_CARE_PROVIDER_SITE_OTHER): Payer: Managed Care, Other (non HMO) | Admitting: Family Medicine

## 2021-07-06 ENCOUNTER — Encounter: Payer: Self-pay | Admitting: Family Medicine

## 2021-07-06 DIAGNOSIS — J0120 Acute ethmoidal sinusitis, unspecified: Secondary | ICD-10-CM

## 2021-07-06 MED ORDER — AMOXICILLIN-POT CLAVULANATE 875-125 MG PO TABS: 1.0000 | ORAL_TABLET | Freq: Two times a day (BID) | ORAL | 0 refills | Status: AC

## 2021-07-06 NOTE — Progress Notes (Signed)
Virtual Video Visit via MyChart Note  I connected with  Luis Lynch on 07/06/21 at  1:00 PM EDT by the video enabled telemedicine application for MyChart, and verified that I am speaking with the correct person using two identifiers.   I introduced myself as a Publishing rights manager with the practice. We discussed the limitations of evaluation and management by telemedicine and the availability of in person appointments. The patient expressed understanding and agreed to proceed.  Participating parties in this visit include: The patient and the nurse practitioner listed.  The patient is: At home I am: In the office - Primary Care Kathryne Sharper  Subjective:    CC:  Chief Complaint  Patient presents with   Sinusitis    HPI: Luis Lynch is a 30 y.o. year old male presenting today via MyChart today for sinusitis symptoms.  Patient reports he had COVID in July. He recovered and felt good for a few weeks, however about 3 weeks ago, URI symptoms returned and have continued to progress. He has had rhinorrhea and nasal congestion with green mucus, ethmoid sinus pain/pressure, fatigue, occasional chest tightness like how he recalls bronchitis feeling. He denies any fevers, headaches, chest pain, dyspnea, dizziness, nausea, vomiting, diarrhea. Reports he has been trying OTC cough/cold/allergy medicines but they don't seem to be helping much. Reports history of frequent sinus infections.    Past medical history, Surgical history, Family history not pertinant except as noted below, Social history, Allergies, and medications have been entered into the medical record, reviewed, and corrections made.   Review of Systems:  All review of systems negative except what is listed in the HPI   Objective:    General:  Speaking clearly in complete sentences. Absent shortness of breath noted.   Alert and oriented x3.   Normal judgment.  Absent acute distress.   Impression and  Recommendations:    1. Acute non-recurrent ethmoidal sinusitis - amoxicillin-clavulanate (AUGMENTIN) 875-125 MG tablet; Take 1 tablet by mouth 2 (two) times daily for 7 days.  Dispense: 14 tablet; Refill: 0  Given duration, will go ahead and treat with Augmentin. Recommend he continue supportive measures including Flonase, Mucinex, rest, hydration, humidifier use, warm compresses, warm liquids, OTC analgesics. Patient aware of signs/symptoms requiring further/urgent evaluation.   Follow-up if symptoms worsen or fail to improve.    I discussed the assessment and treatment plan with the patient. The patient was provided an opportunity to ask questions and all were answered. The patient agreed with the plan and demonstrated an understanding of the instructions.   The patient was advised to call back or seek an in-person evaluation if the symptoms worsen or if the condition fails to improve as anticipated.  I spent 20 minutes dedicated to the care of this patient on the date of this encounter to include pre-visit chart review of prior notes and results, face-to-face time with the patient, and post-visit ordering of testing as indicated.   Clayborne Dana, NP

## 2021-08-24 DIAGNOSIS — F32A Depression, unspecified: Secondary | ICD-10-CM

## 2021-10-19 ENCOUNTER — Encounter: Payer: 59 | Admitting: Professional

## 2021-10-19 ENCOUNTER — Encounter: Payer: Self-pay | Admitting: Professional

## 2021-10-19 NOTE — Progress Notes (Deleted)
A user error has taken place: {error:315308}.   [facility] Counselor Initial Adult Exam  Name: Luis Lynch [facility] Date: 10/19/2021 MRN: [id number] DOB: 09-Nov-1990 PCP: Monica Becton, MD  Time spent: minutes 10-  Guardian/Payee:  patient   Paperwork requested: {MEQ:68341}  Reason for Visit /Presenting Problem: ***  Mental Status Exam: Appearance:   {PSY:22683}     Behavior:  {PSY:21022743}  Motor:  {PSY:22302}  Speech/Language:   {PSY:22685}  Affect:  {PSY:22687}  Mood:  {PSY:31886}  Thought process:  {PSY:31888}  Thought content:    {[phone]}  Sensory/Perceptual disturbances:    {[phone]}  Orientation:  {PSY:30297}  Attention:  {PSY:22877}  Concentration:  {[phone]}  Memory:  {[phone]}  Fund of knowledge:   {[phone]}  Insight:    {[phone]}  Judgment:   {[phone]}  Impulse Control:  {[phone]}   Appearance: {PSY:22683}    Behavior: {PSY:21022743} Motor: {PSY:22302} Speech/Language: {PSY:22685} Affect: {PSY:22687} Mood: {PSY:31886} Thought process: {PSY:31888} Thought content: {[phone]} Sensory/Perceptual disturbances: {[phone]} Orientation: {PSY:30297} Attention: {PSY:22877} Concentration: {[phone]} Memory: {[phone]} Fund of knowledge: {[phone]} Insight: {[phone]} Judgment: {[phone]} Impulse Control: {[phone]}  Reported Symptoms:  ***  Risk Assessment: Danger to Self:  {PSY:22692} Self-injurious Behavior: {PSY:22692} Danger to Others: {PSY:22692} Duty to Warn:{PSY:311194} Physical Aggression / Violence:{PSY:21197} Access to Firearms a concern: {PSY:21197} Gang Involvement:{PSY:21197} Patient / guardian was educated about steps to take if suicide or homicide risk level increases between visits: {Yes/No-Ex:120004} While future psychiatric events cannot be accurately predicted, the patient does not currently require acute  inpatient psychiatric care and does not currently meet [facility] involuntary commitment criteria.  Substance Abuse History: Current substance abuse: {PSY:21197}    Past Psychiatric History:   {Past psych history:20559} Outpatient Providers:*** History of Psych Hospitalization: {PSY:21197} Psychological Testing: {PSY:21014032}   Abuse History:  Victim of: {Abuse History:314532}, {Type of abuse:20566}   Report needed: {PSY:314532} Victim of Neglect:{yes no:314532} Perpetrator of {PSY:20566}  Witness / Exposure to Domestic Violence: {PSY:21197}  Protective Services Involvement: {PSY:21197} Witness to [employer] Violence:  {PSY:21197}  Family History:  Family History  Problem Relation Age of Onset   Alcohol abuse Mother    Hypertension Mother    Heart disease Maternal Grandmother    Cancer Maternal Grandmother        lung   Cancer Maternal Grandfather        colon   Hyperlipidemia Paternal Grandmother    Hypertension Paternal Grandmother    Diabetes Paternal Grandmother    Cancer Paternal Grandfather        bladder   Asthma Brother     Living situation: the patient {lives:315711::"lives with their family"}  Sexual Orientation: {Sexual [phone]}  Relationship Status: {Desc; marital status:62}  Name of spouse / other:*** If a parent, number of children / ages:***  Support Systems: {DIABETES SUPPORT:20310}  Financial Stress:  {YES/NO:21197}  Income/Employment/Disability: Manufacturing engineer: [employer]  Educational History: Education: {PSY :31912}  Religion/Sprituality/World View: {CHL AMB RELIGION/SPIRITUALITY:[phone]}  Any cultural differences that may affect / interfere with treatment:  {Religious/Cultural:200019}  Recreation/Hobbies: {Woc hobbies:30428}  Stressors: {PATIENT STRESSORS:22669}  Strengths: {Patient Coping Strengths:[phone]}  Barriers:  ***   Legal History: Pending legal  issue / charges: {PSY:20588} History of legal issue / charges: {Legal [phone]}  Medical History/Surgical History: {Desc; reviewed/not reviewed:60074} Past Medical History:  Diagnosis Date   Anxiety    Nasal turbinate hypertrophy    PONV (postoperative nausea and vomiting)     Past Surgical History:  Procedure Laterality Date   LEG SURGERY  left   MANDIBLE SURGERY  2004   NASAL SEPTOPLASTY W/ TURBINOPLASTY Bilateral 03/30/2014   Procedure: NASAL SEPTOPLASTY WITH BILATERAL TURBINATE REDUCTION;  Surgeon: Darletta Moll, MD;  Location: [facility];  Service: ENT;  Laterality: Bilateral;   NOSE SURGERY     WISDOM TOOTH EXTRACTION      Medications: Current Outpatient Medications  Medication Sig Dispense Refill   ALPRAZolam (XANAX) 0.5 MG tablet Take 1 tablet (0.5 mg total) by mouth daily as needed for anxiety. 10 tablet 0   cyclobenzaprine (FLEXERIL) 10 MG tablet One half to one tab PO qHS, then increase gradually to one tab TID. 30 tablet 0   escitalopram (LEXAPRO) 10 MG tablet One half tab daily for a week then 1 tab p.o. daily 90 tablet 3   No current facility-administered medications for this visit.    Allergies  Allergen Reactions   Bee Pollen Other (See Comments)   Paxil [Paroxetine Hcl]    Pollen Extract     Diagnoses:  Generalized anxiety disorder  Erroneous encounter - disregard  Plan of Care:  -meet    Teofilo Pod, [facility]     This encounter was created in error - please disregard.

## 2021-10-19 NOTE — Progress Notes (Signed)
A user error has taken place: encounter opened in error, closed for administrative reasons.            This encounter was created in error - please disregard.

## 2021-11-10 ENCOUNTER — Ambulatory Visit: Payer: Managed Care, Other (non HMO) | Admitting: Family Medicine

## 2021-12-24 ENCOUNTER — Encounter: Payer: Self-pay | Admitting: Sports Medicine

## 2022-01-03 ENCOUNTER — Other Ambulatory Visit: Payer: Self-pay

## 2022-01-03 ENCOUNTER — Ambulatory Visit (INDEPENDENT_AMBULATORY_CARE_PROVIDER_SITE_OTHER): Payer: BC Managed Care – PPO

## 2022-01-03 ENCOUNTER — Ambulatory Visit (INDEPENDENT_AMBULATORY_CARE_PROVIDER_SITE_OTHER): Payer: BC Managed Care – PPO | Admitting: Sports Medicine

## 2022-01-03 VITALS — BP 129/85 | HR 73 | Wt 195.1 lb

## 2022-01-03 DIAGNOSIS — M47812 Spondylosis without myelopathy or radiculopathy, cervical region: Secondary | ICD-10-CM | POA: Diagnosis not present

## 2022-01-03 DIAGNOSIS — M542 Cervicalgia: Secondary | ICD-10-CM

## 2022-01-03 DIAGNOSIS — F17200 Nicotine dependence, unspecified, uncomplicated: Secondary | ICD-10-CM | POA: Diagnosis not present

## 2022-01-03 DIAGNOSIS — L739 Follicular disorder, unspecified: Secondary | ICD-10-CM

## 2022-01-03 MED ORDER — DOXYCYCLINE HYCLATE 100 MG PO TABS
100.0000 mg | ORAL_TABLET | Freq: Two times a day (BID) | ORAL | 0 refills | Status: AC
Start: 2022-01-03 — End: 2022-01-10

## 2022-01-03 MED ORDER — PREDNISONE 50 MG PO TABS: ORAL_TABLET | ORAL | 0 refills | Status: DC

## 2022-01-03 MED ORDER — CHANTIX STARTING MONTH PAK 0.5 MG X 11 & 1 MG X 42 PO TBPK
1.0000 | ORAL_TABLET | Freq: Every day | ORAL | 0 refills | Status: DC
Start: 2022-01-03 — End: 2022-01-31

## 2022-01-03 MED ORDER — VARENICLINE TARTRATE 1 MG PO TABS: 1.0000 mg | ORAL_TABLET | Freq: Two times a day (BID) | ORAL | 3 refills | Status: DC

## 2022-01-03 NOTE — Assessment & Plan Note (Signed)
Current smoker, would like help quitting, adding Chantix. ?

## 2022-01-03 NOTE — Assessment & Plan Note (Signed)
Left forearm, adding doxycycline ?

## 2022-01-03 NOTE — Assessment & Plan Note (Addendum)
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 ?Unfortunately continues to have discomfort, particularly when lifting heavy patients, prolonged downgaze. ?At this point I do think we need an advanced imaging, updated x-rays, 5 days of prednisone, additional home conditioning given. ?We discussed maintaining good posture. ?Ordering a cervical spine MRI as well. ?

## 2022-01-03 NOTE — Progress Notes (Signed)
? ? ?  Procedures performed today:   ? ?None. ? ?Independent interpretation of notes and tests performed by another provider:  ? ?None. ? ?Brief History, Exam, Impression, and Recommendations:   ? 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 ?Unfortunately continues to have discomfort, particularly when lifting heavy patients, prolonged downgaze. ?At this point I do think we need an advanced imaging, updated x-rays, 5 days of prednisone, additional home conditioning given. ?We discussed maintaining good posture. ?Ordering a cervical spine MRI as well. ? ?Folliculitis ?Left forearm, adding doxycycline ? ?Smoker ?Current smoker, would like help quitting, adding Chantix. ? ? ? ?___________________________________________ ?[name]. [name], M.D., ABFM., CAQSM. ?Primary Care and Sports Medicine ?[facility] ? ?Adjunct Instructor of Family Medicine  ?[employer] ?

## 2022-01-22 ENCOUNTER — Other Ambulatory Visit: Payer: BC Managed Care – PPO

## 2022-01-31 ENCOUNTER — Other Ambulatory Visit: Payer: Self-pay

## 2022-01-31 ENCOUNTER — Ambulatory Visit: Payer: BC Managed Care – PPO | Admitting: Sports Medicine

## 2022-01-31 DIAGNOSIS — F17200 Nicotine dependence, unspecified, uncomplicated: Secondary | ICD-10-CM

## 2022-01-31 DIAGNOSIS — M47812 Spondylosis without myelopathy or radiculopathy, cervical region: Secondary | ICD-10-CM

## 2022-01-31 DIAGNOSIS — K219 Gastro-esophageal reflux disease without esophagitis: Secondary | ICD-10-CM | POA: Diagnosis not present

## 2022-01-31 DIAGNOSIS — L739 Follicular disorder, unspecified: Secondary | ICD-10-CM

## 2022-01-31 MED ORDER — TRAMADOL HCL 50 MG PO TABS: 50.0000 mg | ORAL_TABLET | Freq: Three times a day (TID) | ORAL | 0 refills | Status: DC | PRN

## 2022-01-31 MED ORDER — PANTOPRAZOLE SODIUM 40 MG PO TBEC: 40.0000 mg | DELAYED_RELEASE_TABLET | Freq: Every day | ORAL | 3 refills | Status: DC

## 2022-01-31 MED ORDER — MELOXICAM 15 MG PO TABS: ORAL_TABLET | ORAL | 3 refills | Status: DC

## 2022-01-31 NOTE — Assessment & Plan Note (Signed)
We had suggested Chantix historically, his pharmacy tech told him it had too many side effects so he never picked it up, he is going to try to stop on his own, I have encouraged him to let me know when he is willing to try Chantix again. ?

## 2022-01-31 NOTE — Assessment & Plan Note (Signed)
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 ?He has a lot of discomfort particularly in the morning and after lifting heavy patients. ?Ultimately prednisone was insufficient so we ordered an MRI, it was too expensive, so we will treat this empirically, adding meloxicam to be taken at night and some tramadol for breakthrough pain. ?When he is ready we can do the MRI. ?

## 2022-01-31 NOTE — Assessment & Plan Note (Signed)
Resolved with course of doxycycline ?

## 2022-01-31 NOTE — Assessment & Plan Note (Signed)
Luis Lynch is having some substernal burning sensations after eating large meals and going to bed, he is asking for Protonix. ?I am happy to send this in but I also advised him to try to limit to multiple small meals and avoid any eating 2 hours prior to laying down. ?

## 2022-01-31 NOTE — Progress Notes (Signed)
? ? ?  Procedures performed today:   ? ?None. ? ?Independent interpretation of notes and tests performed by another provider:  ? ?None. ? ?Brief History, Exam, Impression, and Recommendations:   ? 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 ?He has a lot of discomfort particularly in the morning and after lifting heavy patients. ?Ultimately prednisone was insufficient so we ordered an MRI, it was too expensive, so we will treat this empirically, adding meloxicam to be taken at night and some tramadol for breakthrough pain. ?When he is ready we can do the MRI. ? ?Folliculitis ?Resolved with course of doxycycline ? ?Smoker ?We had suggested Chantix historically, his pharmacy tech told him it had too many side effects so he never picked it up, he is going to try to stop on his own, I have encouraged him to let me know when he is willing to try Chantix again. ? ?GERD (gastroesophageal reflux disease) ?Luis Lynch is having some substernal burning sensations after eating large meals and going to bed, he is asking for Protonix. ?I am happy to send this in but I also advised him to try to limit to multiple small meals and avoid any eating 2 hours prior to laying down. ? ? ? ?___________________________________________ ?[name]. [name], M.D., ABFM., CAQSM. ?Primary Care and Sports Medicine ?[facility] ? ?Adjunct Instructor of Family Medicine  ?[employer] ?

## 2022-03-29 ENCOUNTER — Emergency Department
Admission: EM | Admit: 2022-03-29 | Discharge: 2022-03-29 | Disposition: A | Discharge status: Home / Self Care | Payer: BC Managed Care – PPO | Source: Home / Self Care

## 2022-03-29 ENCOUNTER — Encounter: Payer: Self-pay | Admitting: Emergency Medicine

## 2022-03-29 DIAGNOSIS — R110 Nausea: Secondary | ICD-10-CM

## 2022-03-29 DIAGNOSIS — R519 Headache, unspecified: Secondary | ICD-10-CM | POA: Diagnosis not present

## 2022-03-29 MED ORDER — RIZATRIPTAN BENZOATE 10 MG PO TABS: 10.0000 mg | ORAL_TABLET | ORAL | 0 refills | Status: DC | PRN

## 2022-03-29 MED ORDER — KETOROLAC TROMETHAMINE 60 MG/2ML IM SOLN
60.0000 mg | Freq: Once | INTRAMUSCULAR | Status: AC
  Administered 2022-03-29: 60 mg via INTRAMUSCULAR

## 2022-03-29 MED ORDER — ONDANSETRON 8 MG PO TBDP: 8.0000 mg | ORAL_TABLET | Freq: Three times a day (TID) | ORAL | 0 refills | Status: DC | PRN

## 2022-03-29 MED ORDER — ONDANSETRON 8 MG PO TBDP
8.0000 mg | ORAL_TABLET | Freq: Once | ORAL | Status: AC
  Administered 2022-03-29: 8 mg via ORAL

## 2022-03-29 MED ORDER — METOCLOPRAMIDE HCL 5 MG/ML IJ SOLN
10.0000 mg | INTRAMUSCULAR | Status: AC
Start: 2022-03-29 — End: 2022-03-29
  Administered 2022-03-29: 10 mg via INTRAMUSCULAR

## 2022-03-29 NOTE — ED Provider Notes (Signed)
[facility]    CSN: [id number] Arrival date & time: 03/29/22  1436      History   Chief Complaint Chief Complaint  Patient presents with   Headache   Nausea    HPI Luis Lynch is a 31 y.o. male.   HPI 31 year old male presents with headache and nausea, migraine, sensitivity to sounds and light with aura for 1 day.  Reports history of migraine.  Patient reports/describe headache is bilateral temporal frontal to posterior portion of head, tension type constant with aura.  Patient endorses mild nausea PMH significant for migraine headache and cervical spondylosis.  Past Medical History:  Diagnosis Date   Anxiety    Nasal turbinate hypertrophy    PONV (postoperative nausea and vomiting)     Patient Active Problem List   Diagnosis Date Noted   GERD (gastroesophageal reflux disease) 01/31/2022   Folliculitis 01/03/2022   Smoker 01/03/2022   Migraine headache 02/14/2021   Cervical spondylosis 05/29/2019   Chronic rhinitis 09/20/2016   Idiopathic scoliosis 12/14/2014   Annual physical exam 09/12/2012   Anxiety and depression 03/03/2010   Generalized anxiety disorder 09/08/2007    Past Surgical History:  Procedure Laterality Date   LEG SURGERY     left   MANDIBLE SURGERY  2004   NASAL SEPTOPLASTY W/ TURBINOPLASTY Bilateral 03/30/2014   Procedure: NASAL SEPTOPLASTY WITH BILATERAL TURBINATE REDUCTION;  Surgeon: Darletta MollSui W Teoh, MD;  Location: [facility];  Service: ENT;  Laterality: Bilateral;   NOSE SURGERY     WISDOM TOOTH EXTRACTION         Home Medications    Prior to Admission medications   Medication Sig Start Date End Date Taking? Authorizing Provider  ALPRAZolam Prudy Feeler(XANAX) 0.5 MG tablet Take 1 tablet (0.5 mg total) by mouth daily as needed for anxiety. 05/12/21  Yes Monica Bectonhekkekandam, Thomas J, MD  ondansetron (ZOFRAN-ODT) 8 MG disintegrating tablet Take 1 tablet (8 mg total) by mouth every 8 (eight) hours as needed for nausea or vomiting.  03/29/22  Yes Trevor Ihaagan, Linda Grimmer, FNP  pantoprazole (PROTONIX) 40 MG tablet Take 1 tablet (40 mg total) by mouth daily. 01/31/22  Yes Monica Bectonhekkekandam, Thomas J, MD  rizatriptan (MAXALT) 10 MG tablet Take 1 tablet (10 mg total) by mouth as needed for migraine. May repeat in 2 hours if needed 03/29/22  Yes Trevor Ihaagan, Broedy Osbourne, FNP  escitalopram (LEXAPRO) 10 MG tablet One half tab daily for a week then 1 tab p.o. daily 02/14/21   Monica Bectonhekkekandam, Thomas J, MD  meloxicam (MOBIC) 15 MG tablet One tab PO every 24 hours with a meal for 2 weeks, then once every 24 hours prn pain. 01/31/22   Monica Bectonhekkekandam, Thomas J, MD  traMADol (ULTRAM) 50 MG tablet Take 1 tablet (50 mg total) by mouth every 8 (eight) hours as needed for moderate pain. 01/31/22   Monica Bectonhekkekandam, Thomas J, MD    Family History Family History  Problem Relation Age of Onset   Alcohol abuse Mother    Hypertension Mother    Heart disease Maternal Grandmother    Cancer Maternal Grandmother        lung   Cancer Maternal Grandfather        colon   Hyperlipidemia Paternal Grandmother    Hypertension Paternal Grandmother    Diabetes Paternal Grandmother    Cancer Paternal Grandfather        bladder   Asthma Brother     Social History Social History   Tobacco Use   Smoking  status: Former    Packs/day: 1.00    Years: 2.00    Pack years: 2.00    Types: Cigarettes    Quit date: 03/17/2013    Years since quitting: 9.0   Smokeless tobacco: Never  Vaping Use   Vaping Use: Never used  Substance Use Topics   Alcohol use: Yes    Alcohol/week: 0.0 standard drinks    Comment: Drinks about one drink per couple months   Drug use: No     Allergies   Paxil [paroxetine hcl]   Review of Systems Review of Systems  Neurological:  Positive for headaches.  All other systems reviewed and are negative.   Physical Exam Triage Vital Signs ED Triage Vitals  Enc Vitals Group     BP 03/29/22 1455 132/88     Pulse Rate 03/29/22 1455 78     Resp 03/29/22 1455  18     Temp 03/29/22 1455 98.1 F (36.7 C)     Temp Source 03/29/22 1455 Oral     SpO2 03/29/22 1455 95 %     Weight --      Height --      Head Circumference --      Peak Flow --      Pain Score 03/29/22 1453 4     Pain Loc --      Pain Edu? --      Excl. in GC? --    No data found.  Updated Vital Signs BP 132/88 (BP Location: Right Arm)   Pulse 78   Temp 98.1 F (36.7 C) (Oral)   Resp 18   SpO2 95%   Physical Exam Vitals and nursing note reviewed.  Constitutional:      General: He is not in acute distress.    Appearance: He is well-developed and normal weight.  HENT:     Head: Normocephalic and atraumatic.     Mouth/Throat:     Mouth: Mucous membranes are moist.     Pharynx: Oropharynx is clear.  Eyes:     General: No visual field deficit.    Extraocular Movements: Extraocular movements intact.     Pupils: Pupils are equal, round, and reactive to light.  Cardiovascular:     Rate and Rhythm: Normal rate and regular rhythm.     Heart sounds: Normal heart sounds. No murmur heard. Pulmonary:     Effort: Pulmonary effort is normal.     Breath sounds: Normal breath sounds. No wheezing, rhonchi or rales.  Musculoskeletal:     Cervical back: Normal range of motion and neck supple. No rigidity.  Lymphadenopathy:     Cervical: No cervical adenopathy.  Skin:    General: Skin is warm and dry.  Neurological:     Mental Status: He is alert and oriented to person, place, and time. Mental status is at baseline.     Cranial Nerves: No cranial nerve deficit, dysarthria or facial asymmetry.     Motor: No weakness.     Coordination: Coordination normal.     Gait: Gait normal.     UC Treatments / Results  Labs (all labs ordered are listed, but only abnormal results are displayed) Labs Reviewed - No data to display  EKG   Radiology No results found.  Procedures Procedures (including critical care time)  Medications Ordered in UC Medications  ondansetron  (ZOFRAN-ODT) disintegrating tablet 8 mg (8 mg Oral Given 03/29/22 1502)  ketorolac (TORADOL) injection 60 mg (60 mg Intramuscular Given 03/29/22 1516)  metoCLOPramide (REGLAN) injection 10 mg (10 mg Intramuscular Given 03/29/22 1516)    Initial Impression / Assessment and Plan / UC Course  I have reviewed the triage vital signs and the nursing notes.  Pertinent labs & imaging results that were available during my care of the patient were reviewed by me and considered in my medical decision making (see chart for details).     MDM: 1.  Bad headache-IM Toradol 60 mg, IM Reglan 10 mg given once in clinic, Rx'd Maxalt; 2.  Nausea-8 mg Zofran given once in clinic, Rx'd Zofran. Advised patient may use Maxalt as needed for migraine. Advised patient may use Zofran daily or as needed for nausea.  Encouraged patient to increase daily water intake to 50 to 64 ounces per day.  Advised patient if symptoms worsen and/or unresolved please follow-up with PCP or here for further evaluation.  Work note provided to patient per her request prior to discharge.  Patient discharged home, hemodynamically stable.  Final Clinical Impressions(s) / UC Diagnoses   Final diagnoses:  Bad headache  Nausea     Discharge Instructions      Advised patient may use Maxalt as needed for migraine.  Advised patient may use Zofran daily or as needed for nausea.  Encouraged patient to increase daily water intake to 50 to 64 ounces per day.  Advised patient if symptoms worsen and/or unresolved please follow-up with PCP or here for further evaluation.     ED Prescriptions     Medication Sig Dispense Auth. Provider   rizatriptan (MAXALT) 10 MG tablet Take 1 tablet (10 mg total) by mouth as needed for migraine. May repeat in 2 hours if needed 10 tablet Trevor Iha, FNP   ondansetron (ZOFRAN-ODT) 8 MG disintegrating tablet Take 1 tablet (8 mg total) by mouth every 8 (eight) hours as needed for nausea or vomiting. 24 tablet Trevor Iha, FNP      PDMP not reviewed this encounter.   Trevor Iha, FNP 03/29/22 1525

## 2022-03-29 NOTE — Discharge Instructions (Addendum)
Advised patient may use Maxalt as needed for migraine.  Advised patient may use Zofran daily or as needed for nausea.  Encouraged patient to increase daily water intake to 50 to 64 ounces per day.  Advised patient if symptoms worsen and/or unresolved please follow-up with PCP or here for further evaluation.

## 2022-03-29 NOTE — ED Triage Notes (Signed)
Patient presents to Urgent Care with complaints of migraine, nausea, sensitive to sounds and light, aura since 1 day. Patient reports history of migraine. Usually not as bad prior and OTC medication usually helps. This time not so much. Today took Tylenol

## 2022-05-18 ENCOUNTER — Ambulatory Visit: Payer: BC Managed Care – PPO | Admitting: Sports Medicine

## 2022-05-18 DIAGNOSIS — R030 Elevated blood-pressure reading, without diagnosis of hypertension: Secondary | ICD-10-CM | POA: Insufficient documentation

## 2022-05-18 NOTE — Assessment & Plan Note (Signed)
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 It is very normal here. He is looking for ways to stay healthy, we discussed the Dash eating plan, we discussed an exercise prescription, cutting his carbohydrate intake. We also discussed smoking cessation. We will check some routine labs today per his request, and he will send me some blood pressure readings in about 2 weeks on MyChart, he does understand that these need to be when sitting relaxed for about 10 minutes with the blood pressure cuff at the level of the heart. Body mass index is barely in the overweight range, he will continue his healthy eating plan.

## 2022-05-18 NOTE — Progress Notes (Signed)
    Procedures performed today:    None.  Independent interpretation of notes and tests performed by another provider:   None.  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 It is very normal here. He is looking for ways to stay healthy, we discussed the Dash eating plan, we discussed an exercise prescription, cutting his carbohydrate intake. We also discussed smoking cessation. We will check some routine labs today per his request, and he will send me some blood pressure readings in about 2 weeks on MyChart, he does understand that these need to be when sitting relaxed for about 10 minutes with the blood pressure cuff at the level of the heart. Body mass index is barely in the overweight range, he will continue his healthy eating plan.    ____________________________________________ [name]. [name], M.D., ABFM., CAQSM., AME. Primary Care and Sports Medicine [facility]  Adjunct Professor of Family Medicine  [employer]  Restaurant manager, fast food

## 2022-05-19 LAB — COMPLETE METABOLIC PANEL WITH GFR
AG Ratio: 1.7 (calc) (ref 1.0–2.5)
ALT: 28 U/L (ref 9–46)
AST: 22 U/L (ref 10–40)
Albumin: 4.6 g/dL (ref 3.6–5.1)
Alkaline phosphatase (APISO): 73 U/L (ref 36–130)
BUN: 15 mg/dL (ref 7–25)
CO2: 28 mmol/L (ref 20–32)
Calcium: 10 mg/dL (ref 8.6–10.3)
Chloride: 106 mmol/L (ref 98–110)
Creat: 0.95 mg/dL (ref 0.60–1.26)
Globulin: 2.7 g/dL (calc) (ref 1.9–3.7)
Glucose, Bld: 95 mg/dL (ref 65–99)
Potassium: 4.3 mmol/L (ref 3.5–5.3)
Sodium: 142 mmol/L (ref 135–146)
Total Bilirubin: 0.7 mg/dL (ref 0.2–1.2)
Total Protein: 7.3 g/dL (ref 6.1–8.1)
eGFR: 109 mL/min/{1.73_m2} (ref >=60)

## 2022-05-19 LAB — LIPID PANEL
Cholesterol: 199 mg/dL (ref <200)
HDL: 39 mg/dL — ABNORMAL LOW (ref >=40)
LDL Cholesterol (Calc): 120 mg/dL (calc) — ABNORMAL HIGH
Non-HDL Cholesterol (Calc): 160 mg/dL (calc) — ABNORMAL HIGH (ref <130)
Total CHOL/HDL Ratio: 5.1 (calc) — ABNORMAL HIGH (ref <5.0)
Triglycerides: 247 mg/dL — ABNORMAL HIGH (ref <150)

## 2022-05-19 LAB — HEMOGLOBIN A1C
Hgb A1c MFr Bld: 5.1 % of total Hgb (ref <5.7)
Mean Plasma Glucose: 100 mg/dL
eAG (mmol/L): 5.5 mmol/L

## 2022-05-19 LAB — CBC
HCT: 49.8 % (ref 38.5–50.0)
Hemoglobin: 16.5 g/dL (ref 13.2–17.1)
MCH: 29.6 pg (ref 27.0–33.0)
MCHC: 33.1 g/dL (ref 32.0–36.0)
MCV: 89.4 fL (ref 80.0–100.0)
MPV: 11.8 fL (ref 7.5–12.5)
Platelets: 268 10*3/uL (ref 140–400)
RBC: 5.57 10*6/uL (ref 4.20–5.80)
RDW: 13.1 % (ref 11.0–15.0)
WBC: 6.9 10*3/uL (ref 3.8–10.8)

## 2022-05-19 LAB — TSH: TSH: 1.26 mIU/L (ref 0.40–4.50)

## 2022-06-02 DIAGNOSIS — L301 Dyshidrosis [pompholyx]: Secondary | ICD-10-CM | POA: Diagnosis not present

## 2022-07-13 ENCOUNTER — Encounter (INDEPENDENT_AMBULATORY_CARE_PROVIDER_SITE_OTHER): Payer: BC Managed Care – PPO | Admitting: Sports Medicine

## 2022-07-13 ENCOUNTER — Other Ambulatory Visit: Payer: Self-pay | Admitting: Sports Medicine

## 2022-07-13 DIAGNOSIS — F32A Depression, unspecified: Secondary | ICD-10-CM | POA: Diagnosis not present

## 2022-07-13 DIAGNOSIS — F419 Anxiety disorder, unspecified: Secondary | ICD-10-CM

## 2022-07-13 MED ORDER — ALPRAZOLAM 0.5 MG PO TABS: 0.5000 mg | ORAL_TABLET | Freq: Every day | ORAL | 0 refills | Status: DC | PRN

## 2022-07-13 NOTE — Telephone Encounter (Signed)
I spent 5 total minutes of online digital evaluation and management services in this patient-initiated request for online care. 

## 2022-08-29 ENCOUNTER — Ambulatory Visit
Admission: EM | Admit: 2022-08-29 | Discharge: 2022-08-29 | Disposition: A | Discharge status: Home / Self Care | Payer: BC Managed Care – PPO | Attending: Family Medicine | Admitting: Family Medicine

## 2022-08-29 DIAGNOSIS — J309 Allergic rhinitis, unspecified: Secondary | ICD-10-CM | POA: Diagnosis not present

## 2022-08-29 DIAGNOSIS — J020 Streptococcal pharyngitis: Secondary | ICD-10-CM

## 2022-08-29 LAB — POCT RAPID STREP A (OFFICE): Rapid Strep A Screen: POSITIVE — AB

## 2022-08-29 MED ORDER — AMOXICILLIN-POT CLAVULANATE 875-125 MG PO TABS: 1.0000 | ORAL_TABLET | Freq: Two times a day (BID) | ORAL | 0 refills | Status: AC

## 2022-08-29 MED ORDER — PREDNISONE 20 MG PO TABS: ORAL_TABLET | ORAL | 0 refills | Status: DC

## 2022-08-29 MED ORDER — FEXOFENADINE HCL 180 MG PO TABS: 180.0000 mg | ORAL_TABLET | Freq: Every day | ORAL | 0 refills | Status: DC

## 2022-08-29 NOTE — ED Provider Notes (Signed)
[facility]    CSN: [id number] Arrival date & time: 08/29/22  1320      History   Chief Complaint Chief Complaint  Patient presents with   Sore Throat    HPI Luis Lynch is a 31 y.o. male.   HPI 31 year old male presents with sore throat and nasal congestion for 3 weeks.  Reports tested positive for COVID 2 weeks and those symptoms have subsided.  Additionally reports bilateral ear fullness.  PMH significant for anxiety, nasal turbinate hypertrophy and nicotine dependence.  Past Medical History:  Diagnosis Date   Anxiety    Nasal turbinate hypertrophy    PONV (postoperative nausea and vomiting)     Patient Active Problem List   Diagnosis Date Noted   Elevated blood pressure reading 05/18/2022   GERD (gastroesophageal reflux disease) 89/38/1017   Folliculitis 51/12/5850   Smoker 01/03/2022   Migraine headache 02/14/2021   Cervical spondylosis 05/29/2019   Chronic rhinitis 09/20/2016   Idiopathic scoliosis 12/14/2014   Annual physical exam 09/12/2012   Anxiety and depression 03/03/2010   Generalized anxiety disorder 09/08/2007    Past Surgical History:  Procedure Laterality Date   LEG SURGERY     left   MANDIBLE SURGERY  2004   NASAL SEPTOPLASTY W/ TURBINOPLASTY Bilateral 03/30/2014   Procedure: NASAL SEPTOPLASTY WITH BILATERAL TURBINATE REDUCTION;  Surgeon: Ascencion Dike, MD;  Location: [facility];  Service: ENT;  Laterality: Bilateral;   NOSE SURGERY     WISDOM TOOTH EXTRACTION         Home Medications    Prior to Admission medications   Medication Sig Start Date End Date Taking? Authorizing Provider  amoxicillin-clavulanate (AUGMENTIN) 875-125 MG tablet Take 1 tablet by mouth 2 (two) times daily for 10 days. 08/29/22 09/08/22 Yes Eliezer Lofts, FNP  fexofenadine [facility] ALLERGY) 180 MG tablet Take 1 tablet (180 mg total) by mouth daily for 15 days. 08/29/22 09/13/22 Yes Eliezer Lofts, FNP  predniSONE (DELTASONE) 20 MG  tablet Take 3 tabs PO daily x 5 days. 08/29/22  Yes Eliezer Lofts, FNP  ALPRAZolam Duanne Moron) 0.5 MG tablet Take 1 tablet (0.5 mg total) by mouth daily as needed for anxiety. 07/13/22   Silverio Decamp, MD  meloxicam (MOBIC) 15 MG tablet One tab PO every 24 hours with a meal for 2 weeks, then once every 24 hours prn pain. 01/31/22   Silverio Decamp, MD  ondansetron (ZOFRAN-ODT) 8 MG disintegrating tablet Take 1 tablet (8 mg total) by mouth every 8 (eight) hours as needed for nausea or vomiting. 03/29/22   Eliezer Lofts, FNP  pantoprazole (PROTONIX) 40 MG tablet Take 1 tablet (40 mg total) by mouth daily. 01/31/22   Silverio Decamp, MD  rizatriptan (MAXALT) 10 MG tablet Take 1 tablet (10 mg total) by mouth as needed for migraine. May repeat in 2 hours if needed 03/29/22   Eliezer Lofts, FNP  traMADol (ULTRAM) 50 MG tablet Take 1 tablet (50 mg total) by mouth every 8 (eight) hours as needed for moderate pain. 01/31/22   Silverio Decamp, MD    Family History Family History  Problem Relation Age of Onset   Thyroid disease Mother    Alcohol abuse Mother    Hypertension Mother    Healthy Father    Asthma Brother    Heart disease Maternal Grandmother    Cancer Maternal Grandmother        lung   Cancer Maternal Grandfather  colon   Hyperlipidemia Paternal Grandmother    Hypertension Paternal Grandmother    Diabetes Paternal Grandmother    Cancer Paternal Grandfather        bladder    Social History Social History   Tobacco Use   Smoking status: Every Day    Packs/day: 0.50    Years: 2.00    Total pack years: 1.00    Types: Cigarettes    Last attempt to quit: 03/17/2013    Years since quitting: 9.4   Smokeless tobacco: Never  Vaping Use   Vaping Use: Never used  Substance Use Topics   Alcohol use: Not Currently    Alcohol/week: 1.0 standard drink of alcohol    Types: 1 Cans of beer per week    Comment: weekly   Drug use: No     Allergies   Paxil  [paroxetine hcl]   Review of Systems Review of Systems  HENT:  Positive for congestion and sore throat.   All other systems reviewed and are negative.    Physical Exam Triage Vital Signs ED Triage Vitals  Enc Vitals Group     BP 08/29/22 1348 117/78     Pulse Rate 08/29/22 1348 63     Resp 08/29/22 1348 18     Temp 08/29/22 1348 98.9 F (37.2 C)     Temp Source 08/29/22 1348 Oral     SpO2 08/29/22 1348 96 %     Weight 08/29/22 1346 185 lb (83.9 kg)     Height 08/29/22 1346 6\' 2"  (1.88 m)     Head Circumference --      Peak Flow --      Pain Score 08/29/22 1346 1     Pain Loc --      Pain Edu? --      Excl. in GC? --    No data found.  Updated Vital Signs BP 117/78 (BP Location: Right Arm)   Pulse 63   Temp 98.9 F (37.2 C) (Oral)   Resp 18   Ht 6\' 2"  (1.88 m)   Wt 185 lb (83.9 kg)   SpO2 96%   BMI 23.75 kg/m   Physical Exam Vitals and nursing note reviewed.  Constitutional:      Appearance: Normal appearance. He is normal weight.  HENT:     Head: Normocephalic and atraumatic.     Right Ear: Tympanic membrane, ear canal and external ear normal.     Left Ear: Tympanic membrane, ear canal and external ear normal.     Mouth/Throat:     Mouth: Mucous membranes are moist.     Pharynx: Oropharynx is clear. Uvula midline. Posterior oropharyngeal erythema and uvula swelling present.     Tonsils: 3+ on the right. 3+ on the left.  Eyes:     Extraocular Movements: Extraocular movements intact.     Conjunctiva/sclera: Conjunctivae normal.     Pupils: Pupils are equal, round, and reactive to light.  Cardiovascular:     Rate and Rhythm: Normal rate and regular rhythm.     Pulses: Normal pulses.     Heart sounds: Normal heart sounds. No murmur heard. Pulmonary:     Effort: Pulmonary effort is normal.     Breath sounds: Normal breath sounds. No wheezing, rhonchi or rales.  Musculoskeletal:        General: Normal range of motion.     Cervical back: Normal range of  motion and neck supple. Tenderness present.  Lymphadenopathy:  Cervical: Cervical adenopathy present.  Skin:    General: Skin is warm and dry.  Neurological:     General: No focal deficit present.     Mental Status: He is alert and oriented to person, place, and time. Mental status is at baseline.      UC Treatments / Results  Labs (all labs ordered are listed, but only abnormal results are displayed) Labs Reviewed  POCT RAPID STREP A (OFFICE) - Abnormal; Notable for the following components:      Result Value   Rapid Strep A Screen Positive (*)    All other components within normal limits    EKG   Radiology No results found.  Procedures Procedures (including critical care time)  Medications Ordered in UC Medications - No data to display  Initial Impression / Assessment and Plan / UC Course  I have reviewed the triage vital signs and the nursing notes.  Pertinent labs & imaging results that were available during my care of the patient were reviewed by me and considered in my medical decision making (see chart for details).     MDM: 1.  Strep pharyngitis-Rx'd Augmentin, prednisone; 2.  Allergic rhinitis-Rx'd Allegra. Advised patient to take medications as directed with food to completion.  Advised patient to take Allegra and prednisone with first dose of Augmentin for the next 5 of 10 days.  Advised may use Allegra as needed afterwards for concurrent postnasal drainage/drip.  Encouraged patient increase daily water intake to 64 ounces per day while taking these medications.  Advised patient if symptoms worsen and/or unresolved please follow-up with PCP or here for further evaluation.  Patient discharged home, hemodynamically stable. Final Clinical Impressions(s) / UC Diagnoses   Final diagnoses:  Strep pharyngitis  Allergic rhinitis, unspecified seasonality, unspecified trigger     Discharge Instructions      Advised patient to take medications as directed with  food to completion.  Advised patient to take Allegra and prednisone with first dose of Augmentin for the next 5 of 10 days.  Advised may use Allegra as needed afterwards for concurrent postnasal drainage/drip.  Encouraged patient increase daily water intake to 64 ounces per day while taking these medications.  Advised patient if symptoms worsen and/or unresolved please follow-up with PCP or here for further evaluation.     ED Prescriptions     Medication Sig Dispense Auth. Provider   amoxicillin-clavulanate (AUGMENTIN) 875-125 MG tablet Take 1 tablet by mouth 2 (two) times daily for 10 days. 20 tablet Trevor Iha, FNP   predniSONE (DELTASONE) 20 MG tablet Take 3 tabs PO daily x 5 days. 15 tablet Trevor Iha, FNP   fexofenadine [facility] ALLERGY) 180 MG tablet Take 1 tablet (180 mg total) by mouth daily for 15 days. 15 tablet Trevor Iha, FNP      PDMP not reviewed this encounter.   Trevor Iha, FNP 08/29/22 1422

## 2022-08-29 NOTE — Discharge Instructions (Addendum)
Advised patient to take medications as directed with food to completion.  Advised patient to take Allegra and prednisone with first dose of Augmentin for the next 5 of 10 days.  Advised may use Allegra as needed afterwards for concurrent postnasal drainage/drip.  Encouraged patient increase daily water intake to 64 ounces per day while taking these medications.  Advised patient if symptoms worsen and/or unresolved please follow-up with PCP or here for further evaluation.

## 2022-08-29 NOTE — ED Triage Notes (Signed)
Pt presents to Urgent Care with c/o sore throat and nasal congestion x approx 3 weeks. Tested positive for COVID 2 weeks ago, but states these s/s have not subsided. Also reports bilateral ear fullness x 1 week.

## 2022-10-04 IMAGING — DX DG CERVICAL SPINE COMPLETE 4+V
6 series · 6 of 6 positions shown · non-contrast
Comparison: Cervical spine radiograph 05/29/2019

CLINICAL DATA: Cervical spondylosis. Right-sided neck pain for 1
month. Felt a pop.

EXAM:
CERVICAL SPINE - COMPLETE 4+ VIEW

[c-spine lat]
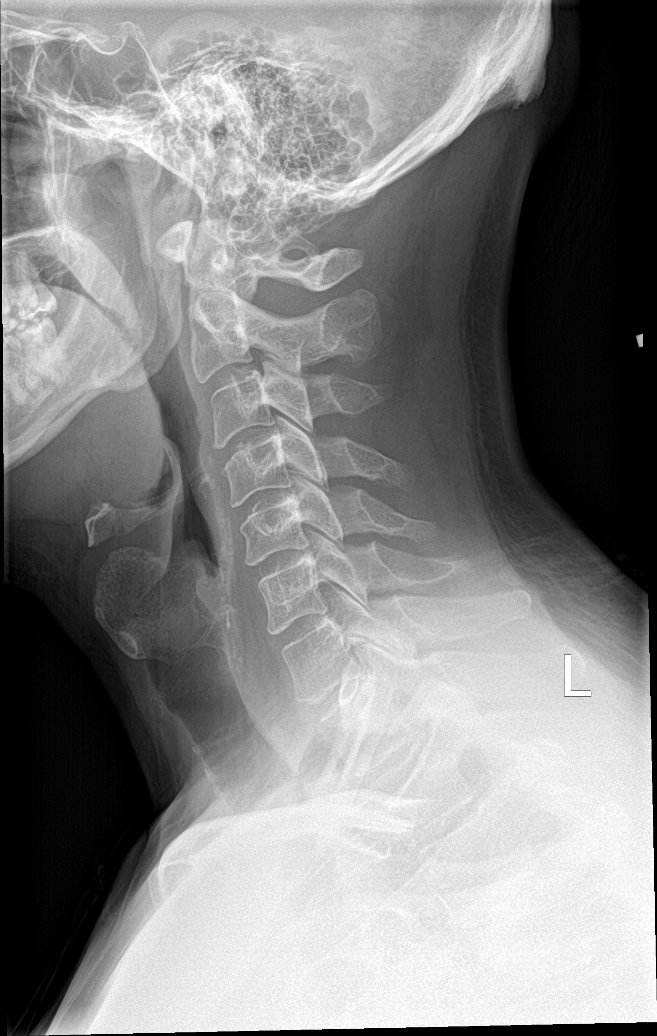

[c-spine obl (1 of 2)]
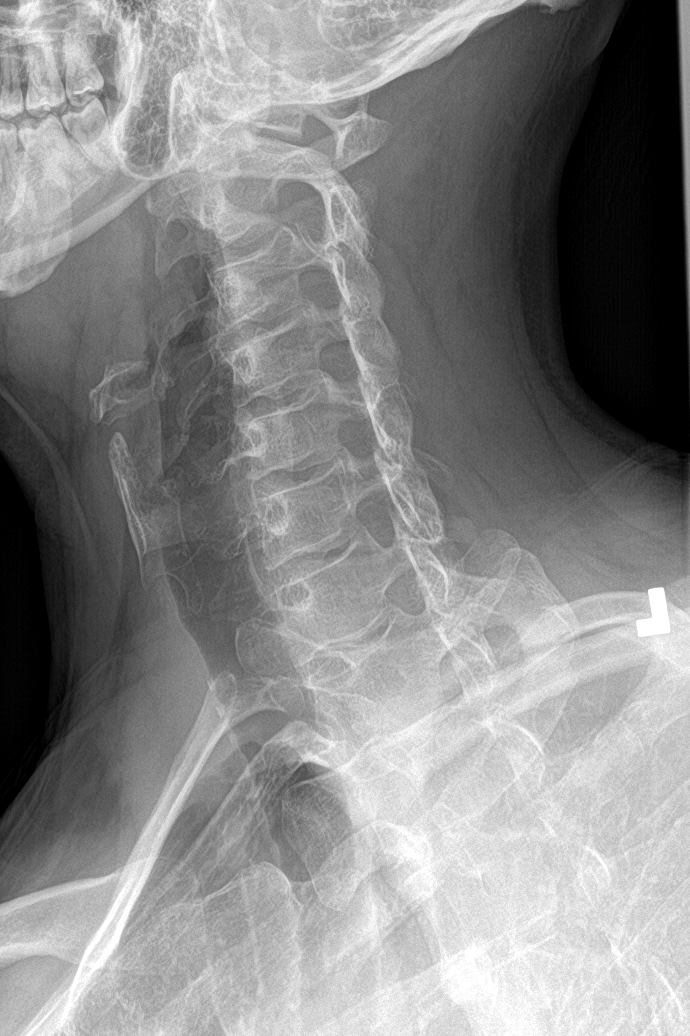

[c-spine obl (2 of 2)]
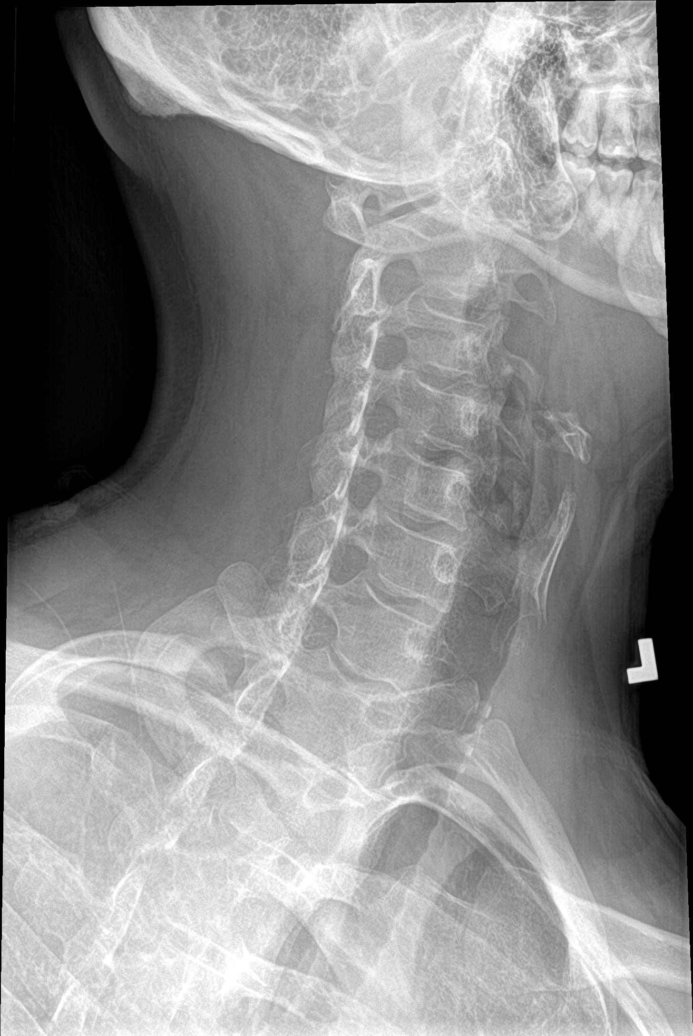

[c-spine ap]
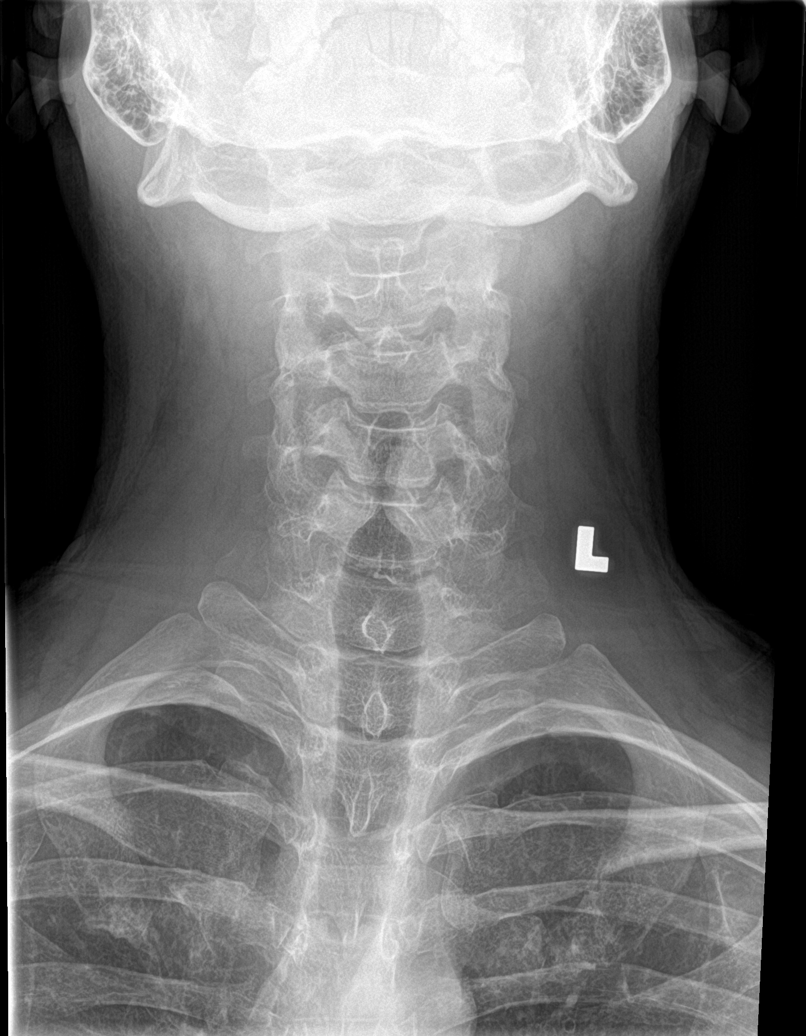

[c-spine open mouth (1 of 2)]
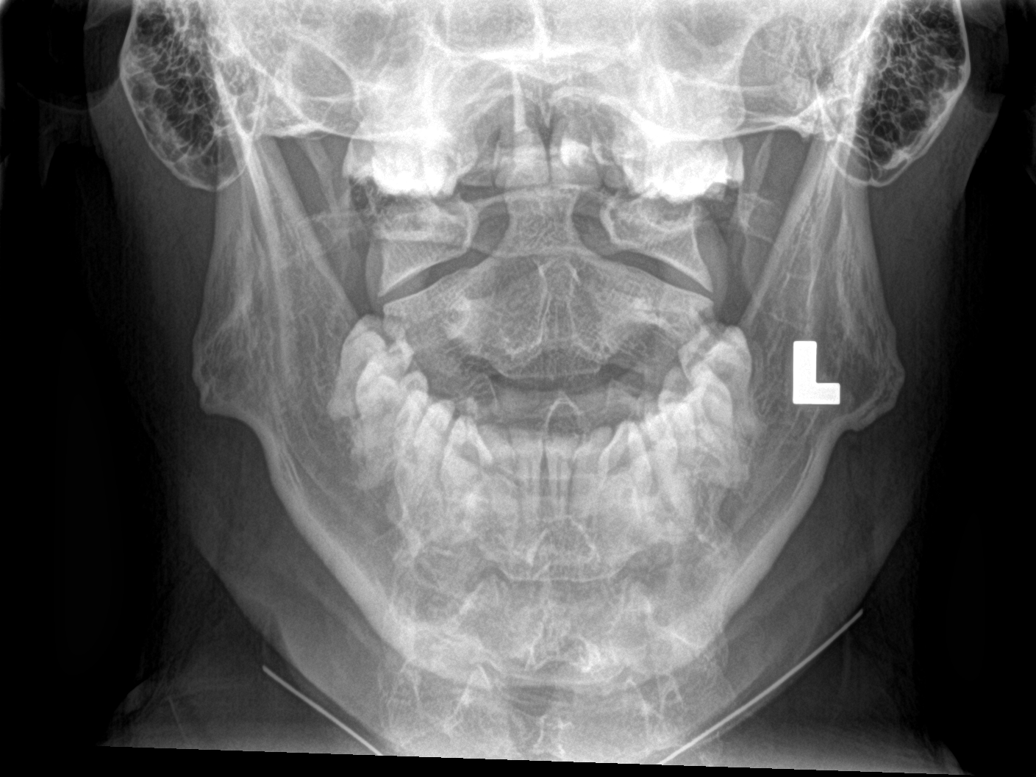

[c-spine open mouth (2 of 2)]
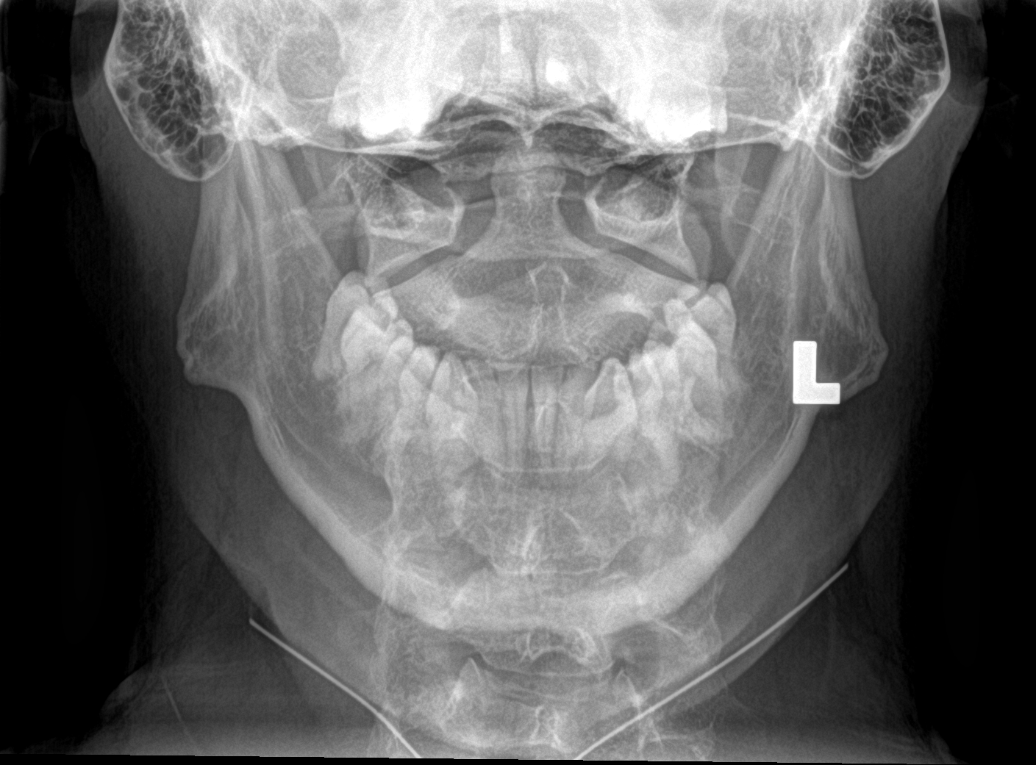

[6 of 6 positions shown; findings below may reference images not displayed]

FINDINGS: Normal alignment. No listhesis. Minimal anterior spurring at C3-C4.
The disc spaces are preserved. Normal vertebral body heights. Slight
facet hypertrophy is similar to prior exam. There is no bony neural
foraminal narrowing. No evidence of fracture or focal bone
abnormality. No prevertebral soft tissue thickening. The lung apices
are clear.
IMPRESSION: Minimal facet hypertrophy and minimal anterior spurring at C3-C4. No
acute bony abnormality.

## 2023-01-02 ENCOUNTER — Ambulatory Visit (INDEPENDENT_AMBULATORY_CARE_PROVIDER_SITE_OTHER): Payer: BC Managed Care – PPO

## 2023-01-02 ENCOUNTER — Encounter: Payer: Self-pay | Admitting: Sports Medicine

## 2023-01-02 ENCOUNTER — Ambulatory Visit: Payer: BC Managed Care – PPO | Admitting: Sports Medicine

## 2023-01-02 VITALS — BP 136/82 | HR 90 | Wt 189.0 lb

## 2023-01-02 DIAGNOSIS — R0789 Other chest pain: Secondary | ICD-10-CM | POA: Diagnosis not present

## 2023-01-02 DIAGNOSIS — R079 Chest pain, unspecified: Secondary | ICD-10-CM | POA: Diagnosis not present

## 2023-01-02 DIAGNOSIS — R059 Cough, unspecified: Secondary | ICD-10-CM | POA: Diagnosis not present

## 2023-01-02 MED ORDER — PREDNISONE 20 MG PO TABS: ORAL_TABLET | ORAL | 0 refills | Status: DC

## 2023-01-02 NOTE — Progress Notes (Signed)
    Procedures performed today:    None.  Independent interpretation of notes and tests performed by another provider:   None.  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 He was also diagnosed with streptococcal tonsillopharyngitis, treated with Augmentin. He has had good improvement in all of his symptoms but continues to have discomfort left costal margin. Worse with stretching, deep breaths and palpation. No overt shortness of breath, no leg swelling, no further respiratory or viral symptoms. On exam he has normal auscultation of the heart and lungs, he has fullness of the left costal margin without discrete areas of tenderness to palpation, I also am able to feel a fullness of the left upper quadrant under the costal margin with may be splenomegaly. For this reason we will do low-dose prednisone (he does not tolerate high-dose), chest x-ray, splenic ultrasound. He can return to see me as needed for this.    ____________________________________________ Gwen Her. Dianah Field, M.D., ABFM., CAQSM., AME. Primary Care and Sports Medicine [facility]  Adjunct Professor of [employer]  Risk manager

## 2023-01-02 NOTE — Assessment & Plan Note (Signed)
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 He was also diagnosed with streptococcal tonsillopharyngitis, treated with Augmentin. He has had good improvement in all of his symptoms but continues to have discomfort left costal margin. Worse with stretching, deep breaths and palpation. No overt shortness of breath, no leg swelling, no further respiratory or viral symptoms. On exam he has normal auscultation of the heart and lungs, he has fullness of the left costal margin without discrete areas of tenderness to palpation, I also am able to feel a fullness of the left upper quadrant under the costal margin with may be splenomegaly. For this reason we will do low-dose prednisone (he does not tolerate high-dose), chest x-ray, splenic ultrasound. He can return to see me as needed for this.

## 2023-01-07 ENCOUNTER — Ambulatory Visit (INDEPENDENT_AMBULATORY_CARE_PROVIDER_SITE_OTHER): Payer: BC Managed Care – PPO

## 2023-01-07 DIAGNOSIS — R0789 Other chest pain: Secondary | ICD-10-CM | POA: Diagnosis not present

## 2023-01-07 DIAGNOSIS — Z0389 Encounter for observation for other suspected diseases and conditions ruled out: Secondary | ICD-10-CM | POA: Diagnosis not present

## 2023-02-10 ENCOUNTER — Other Ambulatory Visit: Payer: Self-pay | Admitting: Sports Medicine

## 2023-02-10 DIAGNOSIS — K219 Gastro-esophageal reflux disease without esophagitis: Secondary | ICD-10-CM

## 2023-02-15 ENCOUNTER — Encounter: Payer: Self-pay | Admitting: Sports Medicine

## 2023-03-04 ENCOUNTER — Encounter: Payer: Self-pay | Admitting: Sports Medicine

## 2023-03-04 ENCOUNTER — Other Ambulatory Visit: Payer: Self-pay | Admitting: Sports Medicine

## 2023-03-04 DIAGNOSIS — F419 Anxiety disorder, unspecified: Secondary | ICD-10-CM

## 2023-03-05 MED ORDER — ALPRAZOLAM 0.5 MG PO TABS: 0.5000 mg | ORAL_TABLET | Freq: Every day | ORAL | 0 refills | Status: DC | PRN

## 2023-03-18 ENCOUNTER — Ambulatory Visit (INDEPENDENT_AMBULATORY_CARE_PROVIDER_SITE_OTHER): Payer: BC Managed Care – PPO

## 2023-03-18 ENCOUNTER — Encounter: Payer: Self-pay | Admitting: Emergency Medicine

## 2023-03-18 ENCOUNTER — Ambulatory Visit
Admission: EM | Admit: 2023-03-18 | Discharge: 2023-03-18 | Disposition: A | Discharge status: Home / Self Care | Payer: BC Managed Care – PPO | Attending: Family Medicine | Admitting: Family Medicine

## 2023-03-18 DIAGNOSIS — R079 Chest pain, unspecified: Secondary | ICD-10-CM | POA: Diagnosis not present

## 2023-03-18 DIAGNOSIS — R071 Chest pain on breathing: Secondary | ICD-10-CM

## 2023-03-18 NOTE — Discharge Instructions (Addendum)
Advised patient of chest x-ray results.  Advised if symptoms worsen and/or unresolved please follow-up with PCP or here for further evaluation.

## 2023-03-18 NOTE — ED Provider Notes (Signed)
[name]    CSN: [id number] Arrival date & time: 03/18/23  1439      History   Chief Complaint Chief Complaint  Patient presents with   Back Pain    HPI Luis Lynch is a 32 y.o. male.   HPI 32 year old male presents with shortness of breath pain on left side of back when taking deep breath and worse in morning.  Patient reports chest pain varying with breathing for the past 7 days.  Patient reports running and doing push-ups 6 days ago, but does not believe this is related to current symptoms.  PMH significant for GERD, chest wall pain and GAD.  Past Medical History:  Diagnosis Date   Anxiety    Nasal turbinate hypertrophy    PONV (postoperative nausea and vomiting)     Patient Active Problem List   Diagnosis Date Noted   Chest wall pain 01/02/2023   Elevated blood pressure reading 05/18/2022   GERD (gastroesophageal reflux disease) 01/31/2022   Folliculitis 01/03/2022   Smoker 01/03/2022   Migraine headache 02/14/2021   Cervical spondylosis 05/29/2019   Chronic rhinitis 09/20/2016   Idiopathic scoliosis 12/14/2014   Annual physical exam 09/12/2012   Anxiety and depression 03/03/2010   Generalized anxiety disorder 09/08/2007    Past Surgical History:  Procedure Laterality Date   LEG SURGERY     left   MANDIBLE SURGERY  2004   NASAL SEPTOPLASTY W/ TURBINOPLASTY Bilateral 03/30/2014   Procedure: NASAL SEPTOPLASTY WITH BILATERAL TURBINATE REDUCTION;  Surgeon: Darletta Moll, MD;  Location: [facility];  Service: ENT;  Laterality: Bilateral;   NOSE SURGERY     WISDOM TOOTH EXTRACTION         Home Medications    Prior to Admission medications   Medication Sig Start Date End Date Taking? Authorizing Provider  ALPRAZolam Prudy Feeler) 0.5 MG tablet Take 1 tablet (0.5 mg total) by mouth daily as needed for anxiety. 03/05/23  Yes Monica Becton, MD  meloxicam (MOBIC) 15 MG tablet One tab PO every 24 hours with a meal for 2 weeks,  then once every 24 hours prn pain. 01/31/22  Yes Monica Becton, MD  pantoprazole (PROTONIX) 40 MG tablet TAKE 1 TABLET BY MOUTH EVERY DAY 02/11/23  Yes Monica Becton, MD  fexofenadine [facility] ALLERGY) 180 MG tablet Take 1 tablet (180 mg total) by mouth daily for 15 days. 08/29/22 09/13/22  Trevor Iha, FNP  ondansetron (ZOFRAN-ODT) 8 MG disintegrating tablet Take 1 tablet (8 mg total) by mouth every 8 (eight) hours as needed for nausea or vomiting. 03/29/22   Trevor Iha, FNP  predniSONE (DELTASONE) 20 MG tablet One tab PO daily for 5 days. 01/02/23   Monica Becton, MD  rizatriptan (MAXALT) 10 MG tablet Take 1 tablet (10 mg total) by mouth as needed for migraine. May repeat in 2 hours if needed 03/29/22   Trevor Iha, FNP  traMADol (ULTRAM) 50 MG tablet Take 1 tablet (50 mg total) by mouth every 8 (eight) hours as needed for moderate pain. 01/31/22   Monica Becton, MD    Family History Family History  Problem Relation Age of Onset   Thyroid disease Mother    Alcohol abuse Mother    Hypertension Mother    Healthy Father    Asthma Brother    Heart disease Maternal Grandmother    Cancer Maternal Grandmother        lung   Cancer Maternal Grandfather  colon   Hyperlipidemia Paternal Grandmother    Hypertension Paternal Grandmother    Diabetes Paternal Grandmother    Cancer Paternal Grandfather        bladder    Social History Social History   Tobacco Use   Smoking status: Every Day    Packs/day: 0.50    Years: 2.00    Additional pack years: 0.00    Total pack years: 1.00    Types: Cigarettes    Last attempt to quit: 03/17/2013    Years since quitting: 10.0   Smokeless tobacco: Never  Vaping Use   Vaping Use: Never used  Substance Use Topics   Alcohol use: Not Currently    Alcohol/week: 1.0 standard drink of alcohol    Types: 1 Cans of beer per week    Comment: weekly   Drug use: No     Allergies   Paxil [paroxetine  hcl]   Review of Systems Review of Systems  Respiratory:  Positive for shortness of breath.   Musculoskeletal:  Positive for back pain.  All other systems reviewed and are negative.    Physical Exam Triage Vital Signs ED Triage Vitals  Enc Vitals Group     BP 03/18/23 1537 117/77     Pulse Rate 03/18/23 1537 69     Resp 03/18/23 1537 18     Temp 03/18/23 1537 98.5 F (36.9 C)     Temp Source 03/18/23 1537 Oral     SpO2 03/18/23 1537 97 %     Weight 03/18/23 1538 187 lb (84.8 kg)     Height 03/18/23 1538 6\' 1"  (1.854 m)     Head Circumference --      Peak Flow --      Pain Score 03/18/23 1538 5     Pain Loc --      Pain Edu? --      Excl. in GC? --    No data found.  Updated Vital Signs BP 117/77 (BP Location: Left Arm)   Pulse 69   Temp 98.5 F (36.9 C) (Oral)   Resp 18   Ht 6\' 1"  (1.854 m)   Wt 187 lb (84.8 kg)   SpO2 97%   BMI 24.67 kg/m      Physical Exam Vitals and nursing note reviewed.  Constitutional:      Appearance: Normal appearance.  HENT:     Head: Normocephalic and atraumatic.     Right Ear: Tympanic membrane, ear canal and external ear normal.     Left Ear: Tympanic membrane, ear canal and external ear normal.     Mouth/Throat:     Mouth: Mucous membranes are moist.     Pharynx: Oropharynx is clear.  Eyes:     Extraocular Movements: Extraocular movements intact.     Conjunctiva/sclera: Conjunctivae normal.     Pupils: Pupils are equal, round, and reactive to light.  Cardiovascular:     Rate and Rhythm: Normal rate and regular rhythm.     Pulses: Normal pulses.     Heart sounds: Normal heart sounds.  Pulmonary:     Effort: Pulmonary effort is normal.     Breath sounds: Normal breath sounds. No wheezing, rhonchi or rales.  Musculoskeletal:        General: Normal range of motion.     Cervical back: Normal range of motion and neck supple.     Comments: Patient reports pain over posterior lateral aspect of bilateral ribs and experiences  chest pain with  breathing  Skin:    General: Skin is warm and dry.  Neurological:     General: No focal deficit present.     Mental Status: He is alert and oriented to person, place, and time. Mental status is at baseline.      UC Treatments / Results  Labs (all labs ordered are listed, but only abnormal results are displayed) Labs Reviewed - No data to display  EKG   Radiology DG Chest 2 View  Result Date: 03/18/2023 CLINICAL DATA:  Chest pain. EXAM: CHEST - 2 VIEW COMPARISON:  Chest radiograph dated January 02, 2019 FINDINGS: The heart size and mediastinal contours are within normal limits. Both lungs are clear. The visualized skeletal structures are unremarkable. IMPRESSION: No active cardiopulmonary disease. Electronically Signed   By: Larose Hires D.O.   On: 03/18/2023 16:23    Procedures Procedures (including critical care time)  Medications Ordered in UC Medications - No data to display  Initial Impression / Assessment and Plan / UC Course  I have reviewed the triage vital signs and the nursing notes.  Pertinent labs & imaging results that were available during my care of the patient were reviewed by me and considered in my medical decision making (see chart for details).     MDM: 1.  Chest pain varying with breathing-CXR revealed above. Advised patient of chest x-ray results.  Advised if symptoms worsen and/or unresolved please follow-up with PCP or here for further evaluation.  Discharged home, hemodynamically stable. Final Clinical Impressions(s) / UC Diagnoses   Final diagnoses:  Chest pain varying with breathing     Discharge Instructions      Advised patient of chest x-ray results.  Advised if symptoms worsen and/or unresolved please follow-up with PCP or here for further evaluation.     ED Prescriptions   None    PDMP not reviewed this encounter.   Trevor Iha, FNP 03/18/23 1649

## 2023-03-18 NOTE — ED Triage Notes (Signed)
Patient states that he was running and doing push ups x 6 days ago.  A couple of days later, he began having some SOB, pain on left side of back and when taken a deep breath.  Feels worse in the mornings.  Taken Meloxicam.

## 2023-04-10 ENCOUNTER — Ambulatory Visit
Admission: EM | Admit: 2023-04-10 | Discharge: 2023-04-10 | Disposition: A | Discharge status: Home / Self Care | Payer: BC Managed Care – PPO | Attending: Family Medicine | Admitting: Family Medicine

## 2023-04-10 ENCOUNTER — Encounter: Payer: Self-pay | Admitting: Emergency Medicine

## 2023-04-10 ENCOUNTER — Other Ambulatory Visit: Payer: Self-pay

## 2023-04-10 DIAGNOSIS — R112 Nausea with vomiting, unspecified: Secondary | ICD-10-CM | POA: Diagnosis not present

## 2023-04-10 DIAGNOSIS — G43109 Migraine with aura, not intractable, without status migrainosus: Secondary | ICD-10-CM | POA: Diagnosis not present

## 2023-04-10 MED ORDER — ONDANSETRON HCL 4 MG/2ML IJ SOLN
4.0000 mg | Freq: Once | INTRAMUSCULAR | Status: AC
  Administered 2023-04-10: 4 mg via INTRAMUSCULAR

## 2023-04-10 MED ORDER — KETOROLAC TROMETHAMINE 60 MG/2ML IM SOLN
60.0000 mg | Freq: Once | INTRAMUSCULAR | Status: AC
  Administered 2023-04-10: 60 mg via INTRAMUSCULAR

## 2023-04-10 MED ORDER — METOCLOPRAMIDE HCL 5 MG/ML IJ SOLN
10.0000 mg | INTRAMUSCULAR | Status: AC
  Administered 2023-04-10: 10 mg via INTRAMUSCULAR

## 2023-04-10 NOTE — Discharge Instructions (Addendum)
Encouraged patient to increase daily water intake to 64 ounces per day.  Advised patient may use OTC Extra Strength Excedrin 1-2 tabs daily as needed for migraine headache. Advised if symptoms worsen and/or unresolved please follow-up with PCP or here for further evaluation.

## 2023-04-10 NOTE — ED Provider Notes (Signed)
[name]    CSN: [id number] Arrival date & time: 04/10/23  1456      History   Chief Complaint Chief Complaint  Patient presents with   Migraine    HPI Luis Lynch is a 32 y.o. male.   HPI 32 year old male presents with migraine headache that began early this morning.  Reports taking Maxalt with little to no improvement.  Reports sensitivity to light sound and reports migraine pain is directly behind his left eye.  PMH significant for migraine headache, current everyday cigarette smoker and GAD.  Patient prefers light off in exam room due to visual acuity changes with light.  Past Medical History:  Diagnosis Date   Anxiety    Nasal turbinate hypertrophy    PONV (postoperative nausea and vomiting)     Patient Active Problem List   Diagnosis Date Noted   Chest wall pain 01/02/2023   Elevated blood pressure reading 05/18/2022   GERD (gastroesophageal reflux disease) 01/31/2022   Folliculitis 01/03/2022   Smoker 01/03/2022   Migraine headache 02/14/2021   Cervical spondylosis 05/29/2019   Chronic rhinitis 09/20/2016   Idiopathic scoliosis 12/14/2014   Annual physical exam 09/12/2012   Anxiety and depression 03/03/2010   Generalized anxiety disorder 09/08/2007    Past Surgical History:  Procedure Laterality Date   LEG SURGERY     left   MANDIBLE SURGERY  2004   NASAL SEPTOPLASTY W/ TURBINOPLASTY Bilateral 03/30/2014   Procedure: NASAL SEPTOPLASTY WITH BILATERAL TURBINATE REDUCTION;  Surgeon: Darletta Moll, MD;  Location: [facility];  Service: ENT;  Laterality: Bilateral;   NOSE SURGERY     WISDOM TOOTH EXTRACTION         Home Medications    Prior to Admission medications   Medication Sig Start Date End Date Taking? Authorizing Provider  ALPRAZolam Prudy Feeler) 0.5 MG tablet Take 1 tablet (0.5 mg total) by mouth daily as needed for anxiety. 03/05/23   Monica Becton, MD  fexofenadine [facility] ALLERGY) 180 MG tablet Take 1  tablet (180 mg total) by mouth daily for 15 days. 08/29/22 09/13/22  Trevor Iha, FNP  meloxicam (MOBIC) 15 MG tablet One tab PO every 24 hours with a meal for 2 weeks, then once every 24 hours prn pain. 01/31/22   Monica Becton, MD  ondansetron (ZOFRAN-ODT) 8 MG disintegrating tablet Take 1 tablet (8 mg total) by mouth every 8 (eight) hours as needed for nausea or vomiting. 03/29/22   Trevor Iha, FNP  pantoprazole (PROTONIX) 40 MG tablet TAKE 1 TABLET BY MOUTH EVERY DAY 02/11/23   Monica Becton, MD  predniSONE (DELTASONE) 20 MG tablet One tab PO daily for 5 days. 01/02/23   Monica Becton, MD  rizatriptan (MAXALT) 10 MG tablet Take 1 tablet (10 mg total) by mouth as needed for migraine. May repeat in 2 hours if needed 03/29/22   Trevor Iha, FNP  traMADol (ULTRAM) 50 MG tablet Take 1 tablet (50 mg total) by mouth every 8 (eight) hours as needed for moderate pain. 01/31/22   Monica Becton, MD    Family History Family History  Problem Relation Age of Onset   Thyroid disease Mother    Alcohol abuse Mother    Hypertension Mother    Healthy Father    Asthma Brother    Heart disease Maternal Grandmother    Cancer Maternal Grandmother        lung   Cancer Maternal Grandfather  colon   Hyperlipidemia Paternal Grandmother    Hypertension Paternal Grandmother    Diabetes Paternal Grandmother    Cancer Paternal Grandfather        bladder    Social History Social History   Tobacco Use   Smoking status: Every Day    Packs/day: 0.50    Years: 2.00    Additional pack years: 0.00    Total pack years: 1.00    Types: Cigarettes    Last attempt to quit: 03/17/2013    Years since quitting: 10.0   Smokeless tobacco: Never  Vaping Use   Vaping Use: Never used  Substance Use Topics   Alcohol use: Not Currently    Alcohol/week: 1.0 standard drink of alcohol    Types: 1 Cans of beer per week    Comment: weekly   Drug use: No     Allergies    Paxil [paroxetine hcl]   Review of Systems Review of Systems  Neurological:  Positive for headaches.  All other systems reviewed and are negative.    Physical Exam Triage Vital Signs ED Triage Vitals  Enc Vitals Group     BP 04/10/23 1506 133/88     Pulse Rate 04/10/23 1506 65     Resp 04/10/23 1506 16     Temp 04/10/23 1506 97.6 F (36.4 C)     Temp Source 04/10/23 1506 Oral     SpO2 04/10/23 1506 96 %     Weight --      Height --      Head Circumference --      Peak Flow --      Pain Score 04/10/23 1507 8     Pain Loc --      Pain Edu? --      Excl. in GC? --    No data found.  Updated Vital Signs BP 133/88 (BP Location: Left Arm)   Pulse 65   Temp 97.6 F (36.4 C) (Oral)   Resp 16   SpO2 96%    Physical Exam Vitals and nursing note reviewed.  Constitutional:      Appearance: Normal appearance. He is normal weight.  HENT:     Head: Normocephalic and atraumatic.     Mouth/Throat:     Mouth: Mucous membranes are moist.     Pharynx: Oropharynx is clear.  Eyes:     Extraocular Movements: Extraocular movements intact.     Conjunctiva/sclera: Conjunctivae normal.     Pupils: Pupils are equal, round, and reactive to light.  Cardiovascular:     Rate and Rhythm: Normal rate and regular rhythm.     Pulses: Normal pulses.  Musculoskeletal:        General: Normal range of motion.     Cervical back: Normal range of motion and neck supple. No tenderness.  Lymphadenopathy:     Cervical: No cervical adenopathy.  Skin:    General: Skin is warm and dry.  Neurological:     General: No focal deficit present.     Mental Status: He is alert and oriented to person, place, and time. Mental status is at baseline.  Psychiatric:        Mood and Affect: Mood normal.        Behavior: Behavior normal.        Thought Content: Thought content normal.      UC Treatments / Results  Labs (all labs ordered are listed, but only abnormal results are displayed) Labs  Reviewed - No  data to display  EKG   Radiology No results found.  Procedures Procedures (including critical care time)  Medications Ordered in UC Medications  ketorolac (TORADOL) injection 60 mg (60 mg Intramuscular Given 04/10/23 1517)  metoCLOPramide (REGLAN) injection 10 mg (10 mg Intramuscular Given 04/10/23 1524)  ondansetron (ZOFRAN) injection 4 mg (4 mg Intramuscular Given 04/10/23 1524)    Initial Impression / Assessment and Plan / UC Course  I have reviewed the triage vital signs and the nursing notes.  Pertinent labs & imaging results that were available during my care of the patient were reviewed by me and considered in my medical decision making (see chart for details).     MDM: 1.  Migraine with aura and without status migrainous, not intractable-Toradol IM 60 mg given once in clinic, Reglan IM 10 mg given once in clinic; 2.  Nausea and vomiting, unspecified vomiting type-Zofran 4 mg given IM once in clinic. Encouraged patient to increase daily water intake to 64 ounces per day.  Advised patient may use OTC Extra Strength Excedrin 1-2 tabs daily as needed for migraine headache. Advised if symptoms worsen and/or unresolved please follow-up with PCP or here for further evaluation.  Patient discharged home, hemodynamically stable.   Final Clinical Impressions(s) / UC Diagnoses   Final diagnoses:  Nausea and vomiting, unspecified vomiting type  Migraine with aura and without status migrainosus, not intractable     Discharge Instructions      Encouraged patient to increase daily water intake to 64 ounces per day.  Advised patient may use OTC Extra Strength Excedrin 1-2 tabs daily as needed for migraine headache. Advised if symptoms worsen and/or unresolved please follow-up with PCP or here for further evaluation.     ED Prescriptions   None    PDMP not reviewed this encounter.   Trevor Iha, FNP 04/10/23 1544

## 2023-04-10 NOTE — ED Triage Notes (Signed)
Hx of migraines. Reports this one started on waking this morning. Has tried caffeine, ibuprofen, rizatriptan without improvement. Experiencing sensitivities to light, sounds, nausea and vomiting. Pain is described as directly behind his left eye. States this does feel like a worse version of his normal migraines.

## 2023-10-09 ENCOUNTER — Encounter (INDEPENDENT_AMBULATORY_CARE_PROVIDER_SITE_OTHER): Payer: Self-pay | Admitting: Sports Medicine

## 2023-10-09 ENCOUNTER — Ambulatory Visit
Admission: EM | Admit: 2023-10-09 | Discharge: 2023-10-09 | Disposition: A | Discharge status: Home / Self Care | Payer: BC Managed Care – PPO | Attending: Family Medicine | Admitting: Family Medicine

## 2023-10-09 ENCOUNTER — Other Ambulatory Visit: Payer: Self-pay

## 2023-10-09 DIAGNOSIS — K219 Gastro-esophageal reflux disease without esophagitis: Secondary | ICD-10-CM | POA: Diagnosis not present

## 2023-10-09 DIAGNOSIS — J069 Acute upper respiratory infection, unspecified: Secondary | ICD-10-CM | POA: Diagnosis not present

## 2023-10-09 LAB — POCT INFLUENZA A/B
Influenza A, POC: NEGATIVE
Influenza B, POC: NEGATIVE

## 2023-10-09 LAB — POC SARS CORONAVIRUS 2 AG -  ED: SARS Coronavirus 2 Ag: NEGATIVE

## 2023-10-09 MED ORDER — OMEPRAZOLE 40 MG PO CPDR: 40.0000 mg | DELAYED_RELEASE_CAPSULE | Freq: Every day | ORAL | 3 refills | Status: DC

## 2023-10-09 MED ORDER — DOXYCYCLINE HYCLATE 100 MG PO CAPS: ORAL_CAPSULE | ORAL | 0 refills | Status: AC

## 2023-10-09 NOTE — ED Provider Notes (Signed)
[name]    CSN: [id number] Arrival date & time: 10/09/23  4098      History   Chief Complaint No chief complaint on file.   HPI Luis Lynch is a 32 y.o. male.   Patient awoke yesterday with sore throat, fatigue, and chills/sweats, followed by a cough productive of brown sputum and nasal congestion.  He had an episode of nausea/vomiting last night.  He denies shortness of breath and pleuritic pain.  Patient smokes one pack per day.   The history is provided by the patient.    Past Medical History:  Diagnosis Date   Anxiety    Nasal turbinate hypertrophy    PONV (postoperative nausea and vomiting)     Patient Active Problem List   Diagnosis Date Noted   Chest wall pain 01/02/2023   Elevated blood pressure reading 05/18/2022   GERD (gastroesophageal reflux disease) 01/31/2022   Folliculitis 01/03/2022   Smoker 01/03/2022   Migraine headache 02/14/2021   Cervical spondylosis 05/29/2019   Chronic rhinitis 09/20/2016   Idiopathic scoliosis 12/14/2014   Annual physical exam 09/12/2012   Anxiety and depression 03/03/2010   Generalized anxiety disorder 09/08/2007    Past Surgical History:  Procedure Laterality Date   LEG SURGERY     left   MANDIBLE SURGERY  2004   NASAL SEPTOPLASTY W/ TURBINOPLASTY Bilateral 03/30/2014   Procedure: NASAL SEPTOPLASTY WITH BILATERAL TURBINATE REDUCTION;  Surgeon: Darletta Moll, MD;  Location: [facility];  Service: ENT;  Laterality: Bilateral;   NOSE SURGERY     WISDOM TOOTH EXTRACTION         Home Medications    Prior to Admission medications   Medication Sig Start Date End Date Taking? Authorizing Provider  ALPRAZolam Prudy Feeler) 0.5 MG tablet Take 1 tablet (0.5 mg total) by mouth daily as needed for anxiety. 03/05/23   Monica Becton, MD  doxycycline (VIBRAMYCIN) 100 MG capsule Take one cap PO Q12hr with food. 10/09/23  Yes Lattie Haw, MD  fexofenadine [facility] ALLERGY) 180 MG tablet  Take 1 tablet (180 mg total) by mouth daily for 15 days. 08/29/22 09/13/22  Trevor Iha, FNP  pantoprazole (PROTONIX) 40 MG tablet TAKE 1 TABLET BY MOUTH EVERY DAY 02/11/23   Monica Becton, MD    Family History Family History  Problem Relation Age of Onset   Thyroid disease Mother    Alcohol abuse Mother    Hypertension Mother    Healthy Father    Asthma Brother    Heart disease Maternal Grandmother    Cancer Maternal Grandmother        lung   Cancer Maternal Grandfather        colon   Hyperlipidemia Paternal Grandmother    Hypertension Paternal Grandmother    Diabetes Paternal Grandmother    Cancer Paternal Grandfather        bladder    Social History Social History   Tobacco Use   Smoking status: Every Day    Current packs/day: 0.00    Average packs/day: 0.5 packs/day for 2.0 years (1.0 ttl pk-yrs)    Types: Cigarettes    Start date: 03/18/2011    Last attempt to quit: 03/17/2013    Years since quitting: 10.5   Smokeless tobacco: Never  Vaping Use   Vaping status: Never Used  Substance Use Topics   Alcohol use: Not Currently    Alcohol/week: 1.0 standard drink of alcohol    Types: 1 Cans of beer  per week    Comment: weekly   Drug use: No     Allergies   Paxil [paroxetine hcl]   Review of Systems Review of Systems + sore throat + cough No pleuritic pain No wheezing + nasal congestion + post-nasal drainage No sinus pain/pressure No itchy/red eyes ? earache No hemoptysis No SOB No fever, + chills/sweats No nausea No vomiting No abdominal pain No diarrhea No urinary symptoms No skin rash + fatigue No myalgias + headache Used OTC meds (Dayquil, Nyquil) without relief   Physical Exam Triage Vital Signs ED Triage Vitals  Encounter Vitals Group     BP 10/09/23 0938 135/84     Systolic BP Percentile --      Diastolic BP Percentile --      Pulse Rate 10/09/23 0938 94     Resp 10/09/23 0938 16     Temp 10/09/23 0938 98.4 F (36.9 C)      Temp src --      SpO2 10/09/23 0938 97 %     Weight --      Height --      Head Circumference --      Peak Flow --      Pain Score 10/09/23 0939 0     Pain Loc --      Pain Education --      Exclude from Growth Chart --    No data found.  Updated Vital Signs BP 135/84   Pulse 94   Temp 98.4 F (36.9 C)   Resp 16   SpO2 97%   Visual Acuity Right Eye Distance:   Left Eye Distance:   Bilateral Distance:    Right Eye Near:   Left Eye Near:    Bilateral Near:     Physical Exam Nursing notes and Vital Signs reviewed. Appearance:  Patient appears stated age, and in no acute distress Eyes:  Pupils are equal, round, and reactive to light and accomodation.  Extraocular movement is intact.  Conjunctivae are not inflamed  Ears:  Canals normal.  Tympanic membranes normal.  Nose:  Mildly congested turbinates.  No sinus tenderness.  Pharynx:  Normal Neck:  Supple.  Mildly enlarged lateral nodes are present, tender to palpation on the left.   Lungs:  Clear to auscultation.  Breath sounds are equal.  Moving air well. Heart:  Regular rate and rhythm without murmurs, rubs, or gallops.  Abdomen:  Nontender without masses or hepatosplenomegaly.  Bowel sounds are present.  No CVA or flank tenderness.  Extremities:  No edema.  Skin:  No rash present.   UC Treatments / Results  Labs (all labs ordered are listed, but only abnormal results are displayed) Labs Reviewed  POCT INFLUENZA A/B negative  POC SARS CORONAVIRUS 2 AG -  ED negative    EKG   Radiology No results found.  Procedures Procedures (including critical care time)  Medications Ordered in UC Medications - No data to display  Initial Impression / Assessment and Plan / UC Course  I have reviewed the triage vital signs and the nursing notes.  Pertinent labs & imaging results that were available during my care of the patient were reviewed by me and considered in my medical decision making (see chart for  details).    Benign exam. There is no evidence of bacterial infection today.  Treat symptomatically for now  Patient is a smoker; will begin doxycycline if not improved about one week (Given a prescription to hold, with an  expiration date)  Followup with Family Doctor if not improved in about 10 days.  Final Clinical Impressions(s) / UC Diagnoses   Final diagnoses:  Viral URI with cough     Discharge Instructions      Take plain guaifenesin (1200mg  extended release tabs such as Mucinex) twice daily, with plenty of water, for cough and congestion.  May add Pseudoephedrine (30mg , one or two every 4 to 6 hours) for sinus congestion.  Get adequate rest.   May use Afrin nasal spray (or generic oxymetazoline) each morning for about 5 days and then discontinue.  Also recommend using saline nasal spray several times daily and saline nasal irrigation (AYR is a common brand).  Use Flonase nasal spray each morning after using Afrin nasal spray and saline nasal irrigation. Try warm salt water gargles for sore throat.  May take Delsym Cough Suppressant ("12 Hour Cough Relief") at bedtime for nighttime cough.  Stop all antihistamines (Nyquil, etc) for now, and other non-prescription cough/cold preparations.   Begin Doxycycline if not improving about one week or if persistent fever develops      ED Prescriptions     Medication Sig Dispense Auth. Provider   doxycycline (VIBRAMYCIN) 100 MG capsule Take one cap PO Q12hr with food. 14 capsule Lattie Haw, MD         Lattie Haw, MD 10/10/23 0630

## 2023-10-09 NOTE — Telephone Encounter (Signed)

## 2023-10-09 NOTE — ED Triage Notes (Signed)
Woke up with sore throat yesterday, started coughing after that. Vomited once last night. Today felt fatigued, coughing up brown phlegm. Started having cold chills today. Has been taking dayquil, nyquil, tylenol. Wants to be checked for pneumonia.

## 2023-10-09 NOTE — Discharge Instructions (Addendum)
Take plain guaifenesin (1200mg  extended release tabs such as Mucinex) twice daily, with plenty of water, for cough and congestion.  May add Pseudoephedrine (30mg , one or two every 4 to 6 hours) for sinus congestion.  Get adequate rest.   May use Afrin nasal spray (or generic oxymetazoline) each morning for about 5 days and then discontinue.  Also recommend using saline nasal spray several times daily and saline nasal irrigation (AYR is a common brand).  Use Flonase nasal spray each morning after using Afrin nasal spray and saline nasal irrigation. Try warm salt water gargles for sore throat.  May take Delsym Cough Suppressant ("12 Hour Cough Relief") at bedtime for nighttime cough.  Stop all antihistamines (Nyquil, etc) for now, and other non-prescription cough/cold preparations.   Begin Doxycycline if not improving about one week or if persistent fever develops

## 2023-11-05 DIAGNOSIS — F17210 Nicotine dependence, cigarettes, uncomplicated: Secondary | ICD-10-CM | POA: Diagnosis not present

## 2023-11-05 DIAGNOSIS — A084 Viral intestinal infection, unspecified: Secondary | ICD-10-CM | POA: Diagnosis not present

## 2023-11-05 DIAGNOSIS — R531 Weakness: Secondary | ICD-10-CM | POA: Diagnosis not present

## 2023-11-05 DIAGNOSIS — R112 Nausea with vomiting, unspecified: Secondary | ICD-10-CM | POA: Diagnosis not present

## 2023-11-05 DIAGNOSIS — E876 Hypokalemia: Secondary | ICD-10-CM | POA: Diagnosis not present

## 2023-11-05 DIAGNOSIS — R197 Diarrhea, unspecified: Secondary | ICD-10-CM | POA: Diagnosis not present

## 2023-11-06 DIAGNOSIS — E876 Hypokalemia: Secondary | ICD-10-CM | POA: Diagnosis not present

## 2023-11-06 DIAGNOSIS — A084 Viral intestinal infection, unspecified: Secondary | ICD-10-CM | POA: Diagnosis not present

## 2023-11-06 DIAGNOSIS — F17210 Nicotine dependence, cigarettes, uncomplicated: Secondary | ICD-10-CM | POA: Diagnosis not present

## 2023-11-06 DIAGNOSIS — R112 Nausea with vomiting, unspecified: Secondary | ICD-10-CM | POA: Diagnosis not present

## 2023-11-06 DIAGNOSIS — R197 Diarrhea, unspecified: Secondary | ICD-10-CM | POA: Diagnosis not present

## 2023-11-06 DIAGNOSIS — R531 Weakness: Secondary | ICD-10-CM | POA: Diagnosis not present

## 2024-02-09 ENCOUNTER — Encounter: Payer: Self-pay | Admitting: Sports Medicine

## 2024-02-09 DIAGNOSIS — K219 Gastro-esophageal reflux disease without esophagitis: Secondary | ICD-10-CM

## 2024-02-10 MED ORDER — FEXOFENADINE HCL 180 MG PO TABS: 180.0000 mg | ORAL_TABLET | Freq: Every day | ORAL | 0 refills | Status: AC

## 2024-02-10 MED ORDER — OMEPRAZOLE 40 MG PO CPDR: 40.0000 mg | DELAYED_RELEASE_CAPSULE | Freq: Every day | ORAL | 3 refills | Status: AC

## 2024-02-10 NOTE — Telephone Encounter (Signed)
 Patient requesting rx rf of  fexofenadine 180mg  08/29/2022 Omeprazole last written 10/09/2023 Last OV 01/02/2023 Upcoming appt = none

## 2024-02-26 ENCOUNTER — Ambulatory Visit: Payer: Self-pay

## 2024-02-26 NOTE — Telephone Encounter (Signed)
 Patient scheduled 02/27/24 with Dr. Sandy Crumb.

## 2024-02-26 NOTE — Telephone Encounter (Signed)
 Chief Complaint: Back pain Symptoms: Back pain similar to muscle strain Frequency: since last week Pertinent Negatives: Patient denies abdominal pain, pain with urination, radiation of pain, numbness/tingling Disposition: [] ED /[] Urgent Care (no appt availability in office) / [x] Appointment(In office/virtual)/ []  [facility]/ [] Home Care/ [] Refused Recommended Disposition /[] [facility]/ []  Follow-up with PCP Additional Notes: Patient called to make an appt with PCP for back pain/muscle strain. Patient feels it in his lower lumbar region of his back on the right side. Patient states it is worsened with movement and trying to sit or stand. Patient has been treating with OTC NSAIDs but no improvement. Patient transferred to [facility] for further assistance with scheduling.   Copied from CRM [id number]. Topic: Clinical - Red Word Triage >> Feb 26, 2024 11:01 AM Jayson Michael wrote: [facility] that prompted transfer to Nurse Triage: pain/ pulled muscle  Patient is requesting a muscle relaxer and steroids. Reports pulling a muscle in his back a few days ago. States he has tried OTC NSAIDs without relief, and his pain is worsening. Describes severe pain with getting up and sitting; notes some improvement with movement. Requesting acute appointment. Reason for Disposition  [1] MODERATE back pain (e.g., interferes with normal activities) AND [2] present > 3 days  Answer Assessment - Initial Assessment Questions 1. ONSET: "When did the pain begin?"      Sometime last week 2. LOCATION: "Where does it hurt?" (upper, mid or lower back)     Right lower lumbar, above buttock - lower thank flank area 3. SEVERITY: "How bad is the pain?"  (e.g., Scale 1-10; mild, moderate, or severe)   - MILD (1-3): Doesn't interfere with normal activities.    - MODERATE (4-7): Interferes with normal activities or awakens from sleep.    - SEVERE (8-10): Excruciating pain, unable to do any normal activities.      When  get up and walking isnt as bad, if sitting or changing positions it is 7 4. PATTERN: "Is the pain constant?" (e.g., yes, no; constant, intermittent)      Changes with movement 5. RADIATION: "Does the pain shoot into your legs or somewhere else?"     No 6. CAUSE:  "What do you think is causing the back pain?"      Thinks he pulled a muscle when picking up a desk that was falling 7. BACK OVERUSE:  "Any recent lifting of heavy objects, strenuous work or exercise?"     See above 8. MEDICINES: "What have you taken so far for the pain?" (e.g., nothing, acetaminophen , NSAIDS)     NSAIDs 9. NEUROLOGIC SYMPTOMS: "Do you have any weakness, numbness, or problems with bowel/bladder control?"     No 10. OTHER SYMPTOMS: "Do you have any other symptoms?" (e.g., fever, abdomen pain, burning with urination, blood in urine)       No  Protocols used: Back Pain-A-AH

## 2024-02-27 ENCOUNTER — Ambulatory Visit: Admitting: Sports Medicine

## 2024-02-27 DIAGNOSIS — M545 Low back pain, unspecified: Secondary | ICD-10-CM

## 2024-02-27 MED ORDER — PREDNISONE 20 MG PO TABS: ORAL_TABLET | ORAL | 0 refills | Status: AC

## 2024-02-27 MED ORDER — CYCLOBENZAPRINE HCL 10 MG PO TABS: ORAL_TABLET | ORAL | 0 refills | Status: AC

## 2024-02-27 NOTE — Progress Notes (Signed)
    Procedures performed today:    None.  Independent interpretation of notes and tests performed by another provider:   None.  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 He tried some leftover prednisone  and a muscle relaxer and it worked really well, he is overall improving. He would like to continue the course of prednisone , it was 20mg  so we will add a full 5-day course, Flexeril , home physical therapy, no x-rays needed, return as needed.    ____________________________________________ Joselyn Nicely. Sandy Crumb, M.D., ABFM., CAQSM., AME. Primary Care and Sports Medicine [facility]  Adjunct Professor of [facility] Medicine  [employer]  Restaurant manager, fast food

## 2024-02-27 NOTE — Assessment & Plan Note (Signed)
 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 He tried some leftover prednisone  and a muscle relaxer and it worked really well, he is overall improving. He would like to continue the course of prednisone , it was 20mg  so we will add a full 5-day course, Flexeril , home physical therapy, no x-rays needed, return as needed.

## 2024-03-13 ENCOUNTER — Encounter: Admitting: Sports Medicine

## 2024-05-07 DIAGNOSIS — Z711 Person with feared health complaint in whom no diagnosis is made: Secondary | ICD-10-CM | POA: Diagnosis not present

## 2024-06-30 DIAGNOSIS — S0125XA Open bite of nose, initial encounter: Secondary | ICD-10-CM | POA: Diagnosis not present

## 2024-06-30 DIAGNOSIS — Z888 Allergy status to other drugs, medicaments and biological substances status: Secondary | ICD-10-CM | POA: Diagnosis not present

## 2024-06-30 DIAGNOSIS — W5581XA Bitten by other mammals, initial encounter: Secondary | ICD-10-CM | POA: Diagnosis not present

## 2024-06-30 DIAGNOSIS — Z203 Contact with and (suspected) exposure to rabies: Secondary | ICD-10-CM | POA: Diagnosis not present

## 2024-06-30 DIAGNOSIS — Z2914 Encounter for prophylactic rabies immune globin: Secondary | ICD-10-CM | POA: Diagnosis not present

## 2024-06-30 DIAGNOSIS — J3489 Other specified disorders of nose and nasal sinuses: Secondary | ICD-10-CM | POA: Diagnosis not present

## 2024-06-30 DIAGNOSIS — Z87891 Personal history of nicotine dependence: Secondary | ICD-10-CM | POA: Diagnosis not present

## 2024-06-30 DIAGNOSIS — Z23 Encounter for immunization: Secondary | ICD-10-CM | POA: Diagnosis not present

## 2024-07-03 DIAGNOSIS — Z203 Contact with and (suspected) exposure to rabies: Secondary | ICD-10-CM | POA: Diagnosis not present

## 2024-07-03 DIAGNOSIS — Z23 Encounter for immunization: Secondary | ICD-10-CM | POA: Diagnosis not present

## 2024-07-07 ENCOUNTER — Encounter: Payer: Self-pay | Admitting: Sports Medicine

## 2024-07-07 DIAGNOSIS — Z203 Contact with and (suspected) exposure to rabies: Secondary | ICD-10-CM | POA: Diagnosis not present

## 2024-07-07 DIAGNOSIS — Z23 Encounter for immunization: Secondary | ICD-10-CM | POA: Diagnosis not present

## 2024-07-14 DIAGNOSIS — Z203 Contact with and (suspected) exposure to rabies: Secondary | ICD-10-CM | POA: Diagnosis not present

## 2024-07-14 DIAGNOSIS — Z23 Encounter for immunization: Secondary | ICD-10-CM | POA: Diagnosis not present

## 2024-08-11 ENCOUNTER — Other Ambulatory Visit (HOSPITAL_BASED_OUTPATIENT_CLINIC_OR_DEPARTMENT_OTHER): Payer: Self-pay

## 2024-08-11 DIAGNOSIS — F419 Anxiety disorder, unspecified: Secondary | ICD-10-CM

## 2024-08-14 MED ORDER — ALPRAZOLAM 0.5 MG PO TABS: 0.5000 mg | ORAL_TABLET | Freq: Every day | ORAL | 0 refills | Status: AC | PRN

## 2024-10-14 DIAGNOSIS — H1033 Unspecified acute conjunctivitis, bilateral: Secondary | ICD-10-CM | POA: Diagnosis not present
